# Patient Record
Sex: Female | Born: 1945 | Race: White | Hispanic: No | Marital: Married | Smoking: Former smoker
Health system: Southern US, Community
[De-identification: ages and names within clinical notes are randomized; demographics above are authoritative.]

## PROBLEM LIST (undated history)

## (undated) DIAGNOSIS — H9319 Tinnitus, unspecified ear: Secondary | ICD-10-CM

## (undated) DIAGNOSIS — F329 Major depressive disorder, single episode, unspecified: Secondary | ICD-10-CM

## (undated) DIAGNOSIS — M1990 Unspecified osteoarthritis, unspecified site: Secondary | ICD-10-CM

## (undated) DIAGNOSIS — I10 Essential (primary) hypertension: Secondary | ICD-10-CM

## (undated) DIAGNOSIS — F419 Anxiety disorder, unspecified: Secondary | ICD-10-CM

## (undated) DIAGNOSIS — E039 Hypothyroidism, unspecified: Secondary | ICD-10-CM

## (undated) DIAGNOSIS — Z973 Presence of spectacles and contact lenses: Secondary | ICD-10-CM

## (undated) DIAGNOSIS — F32A Depression, unspecified: Secondary | ICD-10-CM

## (undated) HISTORY — PX: OTHER SURGICAL HISTORY: SHX169

---

## 1970-11-24 HISTORY — PX: OTHER SURGICAL HISTORY: SHX169

## 1998-11-24 HISTORY — PX: ABDOMINAL HYSTERECTOMY: SHX81

## 2014-11-24 HISTORY — PX: OTHER SURGICAL HISTORY: SHX169

## 2014-11-24 HISTORY — PX: CHOLECYSTECTOMY: SHX55

## 2016-02-18 ENCOUNTER — Other Ambulatory Visit (HOSPITAL_COMMUNITY): Payer: Self-pay | Admitting: *Deleted

## 2016-02-18 NOTE — Patient Instructions (Addendum)
Kelly Everett  02/18/2016   Your procedure is scheduled on: 03/04/16  [facility]  [place]trance [place]  elevators to 3rd floor to  Short Stay Center at 0515 AM.  Call this number if you have problems the morning of surgery [phone]   Remember: ONLY 1 PERSON MAY GO WITH YOU TO SHORT STAY TO GET  READY MORNING OF YOUR SURGERY.  Do not eat food or drink liquids :After Midnight. Monday night     Take these medicines the morning of surgery with A SIP OF WATER: Levothyroxine                               You may not have any metal on your body including hair pins and              piercings  Do not wear jewelry, make-up, lotions, powders or perfumes, deodorant             Do not wear nail polish.  Do not shave  48 hours prior to surgery.              Men may shave face and neck.   Do not bring valuables to the hospital. [facility] IS NOT             RESPONSIBLE   FOR VALUABLES.  Contacts, dentures or bridgework may not be worn into surgery.  Leave suitcase in the car. After surgery it may be brought to your room.   _____________________________________________________________________              WHAT IS A BLOOD TRANSFUSION? Blood Transfusion Information  A transfusion is the replacement of blood or some of its parts. Blood is made up of multiple cells which provide different functions.  Red blood cells carry oxygen and are used for blood loss replacement.  White blood cells fight against infection.  Platelets control bleeding.  Plasma helps clot blood.  Other blood products are available for specialized needs, such as hemophilia or other clotting disorders. BEFORE THE TRANSFUSION  Who gives blood for transfusions?   Healthy volunteers who are fully evaluated to make sure their blood is safe. This is blood bank blood. Transfusion therapy is the safest it has ever been in the practice of medicine. Before blood is taken from a  donor, a complete history is taken to make sure that person has no history of diseases nor engages in risky social behavior (examples are intravenous drug use or sexual activity with multiple partners). The donor's travel history is screened to minimize risk of transmitting infections, such as malaria. The donated blood is tested for signs of infectious diseases, such as HIV and hepatitis. The blood is then tested to be sure it is compatible with you in order to minimize the chance of a transfusion reaction. If you or a relative donates blood, this is often done in anticipation of surgery and is not appropriate for emergency situations. It takes many days to process the donated blood. RISKS AND COMPLICATIONS Although transfusion therapy is very safe and saves many lives, the main dangers of transfusion include:  1. Getting an infectious disease. 2. Developing a transfusion reaction. This is an allergic reaction to something in the blood you were given. Every precaution is taken to prevent this. The decision to have a blood  transfusion has been considered carefully by your caregiver before blood is given. Blood is not given unless the benefits outweigh the risks. AFTER THE TRANSFUSION  Right after receiving a blood transfusion, you will usually feel much better and more energetic. This is especially true if your red blood cells have gotten low (anemic). The transfusion raises the level of the red blood cells which carry oxygen, and this usually causes an energy increase.  The nurse administering the transfusion will monitor you carefully for complications. HOME CARE INSTRUCTIONS  No special instructions are needed after a transfusion. You may find your energy is better. Speak with your caregiver about any limitations on activity for underlying diseases you may have. SEEK MEDICAL CARE IF:   Your condition is not improving after your transfusion.  You develop redness or irritation at the intravenous  (IV) site. SEEK IMMEDIATE MEDICAL CARE IF:  Any of the following symptoms occur over the next 12 hours:  Shaking chills.  You have a temperature by mouth above 102 F (38.9 C), not controlled by medicine.  Chest, back, or muscle pain.  People around you feel you are not acting correctly or are confused.  Shortness of breath or difficulty breathing.  Dizziness and fainting.  You get a rash or develop hives.  You have a decrease in urine output.  Your urine turns a dark color or changes to pink, red, or brown. Any of the following symptoms occur over the next 10 days:  You have a temperature by mouth above 102 F (38.9 C), not controlled by medicine.  Shortness of breath.  Weakness after normal activity.  The white part of the eye turns yellow (jaundice).  You have a decrease in the amount of urine or are urinating less often.  Your urine turns a dark color or changes to pink, red, or brown. Document Released: 11/07/2000 Document Revised: 02/02/2012 Document Reviewed: 06/26/2008 ExitCare Patient Information 2014 [place], [place].  _______________________________________________________________________  Incentive Spirometer  An incentive spirometer is a tool that can help keep your lungs clear and active. This tool measures how well you are filling your lungs with each breath. Taking long deep breaths may help reverse or decrease the chance of developing breathing (pulmonary) problems (especially infection) following:  A long period of time when you are unable to move or be active. BEFORE THE PROCEDURE   If the spirometer includes an indicator to show your best effort, your nurse or respiratory therapist will set it to a desired goal.  If possible, sit up straight or lean slightly forward. Try not to slouch.  Hold the incentive spirometer in an upright position. INSTRUCTIONS FOR USE  3. Sit on the edge of your bed if possible, or sit up as far as you can in bed or on  a chair. 4. Hold the incentive spirometer in an upright position. 5. Breathe out normally. 6. Place the mouthpiece in your mouth and seal your lips tightly around it. 7. Breathe in slowly and as deeply as possible, raising the piston or the ball toward the top of the column. 8. Hold your breath for 3-5 seconds or for as long as possible. Allow the piston or ball to fall to the bottom of the column. 9. Remove the mouthpiece from your mouth and breathe out normally. 10. Rest for a few seconds and repeat Steps 1 through 7 at least 10 times every 1-2 hours when you are awake. Take your time and take a few normal breaths between deep breaths.  11. The spirometer may include an indicator to show your best effort. Use the indicator as a goal to work toward during each repetition. 12. After each set of 10 deep breaths, practice coughing to be sure your lungs are clear. If you have an incision (the cut made at the time of surgery), support your incision when coughing by placing a pillow or rolled up towels firmly against it. Once you are able to get out of bed, walk around indoors and cough well. You may stop using the incentive spirometer when instructed by your caregiver.  RISKS AND COMPLICATIONS  Take your time so you do not get dizzy or light-headed.  If you are in pain, you may need to take or ask for pain medication before doing incentive spirometry. It is harder to take a deep breath if you are having pain. AFTER USE  Rest and breathe slowly and easily.  It can be helpful to keep track of a log of your progress. Your caregiver can provide you with a simple table to help with this. If you are using the spirometer at home, follow these instructions: SEEK MEDICAL CARE IF:   You are having difficultly using the spirometer.  You have trouble using the spirometer as often as instructed.  Your pain medication is not giving enough relief while using the spirometer.  You develop fever of 100.5 F  (38.1 C) or higher. SEEK IMMEDIATE MEDICAL CARE IF:   You cough up bloody sputum that had not been present before.  You develop fever of 102 F (38.9 C) or greater.  You develop worsening pain at or near the incision site. MAKE SURE YOU:   Understand these instructions.  Will watch your condition.  Will get help right away if you are not doing well or get worse. Document Released: 03/23/2007 Document Revised: 02/02/2012 Document Reviewed: 05/24/2007 ExitCare Patient Information 2014 [place].   ________________________________________________________________________ [facility] - Preparing for Surgery Before surgery, you can play an important role.  Because skin is not sterile, your skin needs to be as free of germs as possible.  You can reduce the number of germs on your skin by washing with CHG (chlorahexidine gluconate) soap before surgery.  CHG is an antiseptic cleaner which kills germs and bonds with the skin to continue killing germs even after washing. Please DO NOT use if you have an allergy to CHG or antibacterial soaps.  If your skin becomes reddened/irritated stop using the CHG and inform your nurse when you arrive at Short Stay. Do not shave (including legs and underarms) for at least 48 hours prior to the first CHG shower.  You may shave your face/neck. Please follow these instructions carefully:  1.  Shower with CHG Soap the night before surgery and the  morning of Surgery.  2.  If you choose to wash your hair, wash your hair first as usual with your  normal  shampoo.  3.  After you shampoo, rinse your hair and body thoroughly to remove the  shampoo.                           4.  Use CHG as you would any other liquid soap.  You can apply chg directly  to the skin and wash                       Gently with a scrungie or clean washcloth.  5.  Apply the CHG Soap to your body ONLY FROM THE NECK DOWN.   Do not use on face/ open                           Wound or open  sores. Avoid contact with eyes, ears mouth and genitals (private parts).                       Wash face,  Genitals (private parts) with your normal soap.             6.  Wash thoroughly, paying special attention to the area where your surgery  will be performed.  7.  Thoroughly rinse your body with warm water from the neck down.  8.  DO NOT shower/wash with your normal soap after using and rinsing off  the CHG Soap.                9.  Pat yourself dry with a clean towel.            10.  Wear clean pajamas.            11.  Place clean sheets on your bed the night of your first shower and do not  sleep with pets. Day of Surgery : Do not apply any lotions/deodorants the morning of surgery.  Please wear clean clothes to the hospital/surgery center.  FAILURE TO FOLLOW THESE INSTRUCTIONS MAY RESULT IN THE CANCELLATION OF YOUR SURGERY PATIENT SIGNATURE_________________________________  NURSE SIGNATURE__________________________________  ________________________________________________________________________

## 2016-02-18 NOTE — Progress Notes (Signed)
Clearance Dr Janeece FittingEggleston-Clark on chart Chest 8/16 chart

## 2016-02-19 ENCOUNTER — Encounter (HOSPITAL_COMMUNITY): Payer: Self-pay

## 2016-02-19 ENCOUNTER — Encounter (HOSPITAL_COMMUNITY)
Admission: RE | Admit: 2016-02-19 | Discharge: 2016-02-19 | Disposition: A | Discharge status: Home / Self Care | Payer: Medicare Other | Source: Ambulatory Visit | Attending: Orthopedic Surgery | Admitting: Orthopedic Surgery

## 2016-02-19 DIAGNOSIS — Z01810 Encounter for preprocedural cardiovascular examination: Secondary | ICD-10-CM | POA: Insufficient documentation

## 2016-02-19 DIAGNOSIS — Z01812 Encounter for preprocedural laboratory examination: Secondary | ICD-10-CM | POA: Insufficient documentation

## 2016-02-19 HISTORY — DX: Hypothyroidism, unspecified: E03.9

## 2016-02-19 HISTORY — DX: Depression, unspecified: F32.A

## 2016-02-19 HISTORY — DX: Unspecified osteoarthritis, unspecified site: M19.90

## 2016-02-19 HISTORY — DX: Essential (primary) hypertension: I10

## 2016-02-19 HISTORY — DX: Major depressive disorder, single episode, unspecified: F32.9

## 2016-02-19 LAB — URINALYSIS, ROUTINE W REFLEX MICROSCOPIC
Bilirubin Urine: NEGATIVE
Glucose, UA: NEGATIVE mg/dL
HGB URINE DIPSTICK: NEGATIVE
KETONES UR: NEGATIVE mg/dL
Leukocytes, UA: NEGATIVE
Nitrite: NEGATIVE
PROTEIN: NEGATIVE mg/dL
Specific Gravity, Urine: 1.017 (ref 1.005–1.030)
pH: 6.5 (ref 5.0–8.0)

## 2016-02-19 LAB — CBC
HEMATOCRIT: 43.9 % (ref 36.0–46.0)
HEMOGLOBIN: 14.4 g/dL (ref 12.0–15.0)
MCH: 30.6 pg (ref 26.0–34.0)
MCHC: 32.8 g/dL (ref 30.0–36.0)
MCV: 93.2 fL (ref 78.0–100.0)
Platelets: 177 10*3/uL (ref 150–400)
RBC: 4.71 MIL/uL (ref 3.87–5.11)
RDW: 13.6 % (ref 11.5–15.5)
WBC: 5.7 10*3/uL (ref 4.0–10.5)

## 2016-02-19 LAB — BASIC METABOLIC PANEL
ANION GAP: 10 (ref 5–15)
BUN: 12 mg/dL (ref 6–20)
CALCIUM: 9.6 mg/dL (ref 8.9–10.3)
CO2: 29 mmol/L (ref 22–32)
Chloride: 105 mmol/L (ref 101–111)
Creatinine, Ser: 0.64 mg/dL (ref 0.44–1.00)
GLUCOSE: 105 mg/dL — AB (ref 65–99)
POTASSIUM: 4.3 mmol/L (ref 3.5–5.1)
Sodium: 144 mmol/L (ref 135–145)

## 2016-02-19 LAB — SURGICAL PCR SCREEN
MRSA, PCR: NEGATIVE
STAPHYLOCOCCUS AUREUS: NEGATIVE

## 2016-02-19 LAB — PROTIME-INR
INR: 1.13 (ref 0.00–1.49)
PROTHROMBIN TIME: 14.7 s (ref 11.6–15.2)

## 2016-02-19 LAB — APTT: APTT: 28 s (ref 24–37)

## 2016-02-22 NOTE — H&P (Signed)
TOTAL HIP ADMISSION H&P  Patient is admitted for right total hip arthroplasty, anterior approach.  Subjective:  Chief Complaint:   Right hip primary OA / pain  HPI: Kelly Everett, 70 y.o. female, has a history of pain and functional disability in the right hip(s) due to arthritis and patient has failed non-surgical conservative treatments for greater than 12 weeks to include NSAID's and/or analgesics, corticosteriod injections and activity modification.  Onset of symptoms was gradual starting 3+ years ago with gradually worsening course since that time.The patient noted no past surgery on the right hip(s).  Patient currently rates pain in the right hip at 8 out of 10 with activity. Patient has night pain, worsening of pain with activity and weight bearing, trendelenberg gait, pain that interfers with activities of daily living and pain with passive range of motion. Patient has evidence of periarticular osteophytes and joint space narrowing by imaging studies. This condition presents safety issues increasing the risk of falls.  There is no current active infection.  Risks, benefits and expectations were discussed with the patient.  Risks including but not limited to the risk of anesthesia, blood clots, nerve damage, blood vessel damage, failure of the prosthesis, infection and up to and including death.  Patient understand the risks, benefits and expectations and wishes to proceed with surgery.   PCP: Valla LeaverEGGLESTON-CLARK,VALENICA, MD  D/C Plans:      Home  Post-op Meds:       No Rx given  Tranexamic Acid:      To be given - IV  Decadron:      Is to be given  FYI:     ASA  Norco    Past Medical History  Diagnosis Date  . Hypertension   . Hypothyroidism   . Depression   . Arthritis     OA    Past Surgical History  Procedure Laterality Date  . Cesarean section  1974 AND 1980  . Surgery for endometriossis  1972  . Abdominal hysterectomy  2000    COMPLETE  . Complete thyroidectomy   2016  . Cholecystectomy  2016  . Thumb surgery Bilateral YRS AGO    No prescriptions prior to admission   No Known Allergies   Social History  Substance Use Topics  . Smoking status: Never Smoker   . Smokeless tobacco: Never Used  . Alcohol Use: Yes     Comment: OCCASIONAL       Review of Systems  Constitutional: Negative.   HENT: Negative.   Eyes: Negative.   Respiratory: Negative.   Cardiovascular: Negative.   Gastrointestinal: Negative.   Genitourinary: Negative.   Musculoskeletal: Positive for joint pain.  Skin: Negative.   Neurological: Negative.   Endo/Heme/Allergies: Negative.   Psychiatric/Behavioral: Positive for depression.    Objective:  Physical Exam  Constitutional: She is oriented to person, place, and time. She appears well-developed.  HENT:  Head: Normocephalic.  Eyes: Pupils are equal, round, and reactive to light.  Neck: Neck supple. No JVD present. No tracheal deviation present. No thyromegaly present.  Cardiovascular: Normal rate, regular rhythm, normal heart sounds and intact distal pulses.   Respiratory: Effort normal and breath sounds normal. No stridor. No respiratory distress. She has no wheezes.  GI: Soft. There is no tenderness. There is no guarding.  Musculoskeletal:       Right hip: She exhibits decreased range of motion, decreased strength, tenderness and bony tenderness. She exhibits no swelling, no deformity and no laceration.  Lymphadenopathy:  She has no cervical adenopathy.  Neurological: She is alert and oriented to person, place, and time.  Skin: Skin is warm and dry.  Psychiatric: She has a normal mood and affect.      Imaging Review Plain radiographs demonstrate severe degenerative joint disease of the right hip(s). The bone quality appears to be good for age and reported activity level.  Assessment/Plan:  End stage arthritis, right hip(s)  The patient history, physical examination, clinical judgement of the  provider and imaging studies are consistent with end stage degenerative joint disease of the right hip(s) and total hip arthroplasty is deemed medically necessary. The treatment options including medical management, injection therapy, arthroscopy and arthroplasty were discussed at length. The risks and benefits of total hip arthroplasty were presented and reviewed. The risks due to aseptic loosening, infection, stiffness, dislocation/subluxation,  thromboembolic complications and other imponderables were discussed.  The patient acknowledged the explanation, agreed to proceed with the plan and consent was signed. Patient is being admitted for inpatient treatment for surgery, pain control, PT, OT, prophylactic antibiotics, VTE prophylaxis, progressive ambulation and ADL's and discharge planning.The patient is planning to be discharged home.       Anastasio Auerbach Joyce Leckey   PA-C  02/22/2016, 7:45 AM

## 2016-03-03 NOTE — Anesthesia Preprocedure Evaluation (Addendum)
Anesthesia Evaluation  Patient identified by MRN, date of birth, ID band Patient awake    Reviewed: Allergy & Precautions, NPO status , Patient's Chart, lab work & pertinent test results  History of Anesthesia Complications Negative for: history of anesthetic complications  Airway Mallampati: II   Neck ROM: Full    Dental  (+) Dental Advisory Given, Caps   Pulmonary neg pulmonary ROS,    Pulmonary exam normal        Cardiovascular hypertension, Pt. on medications Normal cardiovascular exam     Neuro/Psych PSYCHIATRIC DISORDERS Depression negative neurological ROS     GI/Hepatic negative GI ROS, Neg liver ROS,   Endo/Other  Hypothyroidism   Renal/GU negative Renal ROS     Musculoskeletal  (+) Arthritis ,   Abdominal   Peds  Hematology   Anesthesia Other Findings   Reproductive/Obstetrics                          Anesthesia Physical Anesthesia Plan  ASA: II  Anesthesia Plan: MAC and Spinal   Post-op Pain Management:    Induction:   Airway Management Planned: Simple Face Mask  Additional Equipment:   Intra-op Plan:   Post-operative Plan:   Informed Consent: I have reviewed the patients History and Physical, chart, labs and discussed the procedure including the risks, benefits and alternatives for the proposed anesthesia with the patient or authorized representative who has indicated his/her understanding and acceptance.   Dental advisory given  Plan Discussed with: Anesthesiologist, CRNA and Surgeon  Anesthesia Plan Comments:        Anesthesia Quick Evaluation

## 2016-03-04 ENCOUNTER — Inpatient Hospital Stay (HOSPITAL_COMMUNITY): Payer: Medicare Other | Admitting: Anesthesiology

## 2016-03-04 ENCOUNTER — Inpatient Hospital Stay (HOSPITAL_COMMUNITY): Payer: Medicare Other

## 2016-03-04 ENCOUNTER — Encounter (HOSPITAL_COMMUNITY): Payer: Self-pay | Admitting: *Deleted

## 2016-03-04 ENCOUNTER — Inpatient Hospital Stay (HOSPITAL_COMMUNITY)
Admission: RE | Admit: 2016-03-04 | Discharge: 2016-03-05 | DRG: 470 | Disposition: A | Discharge status: Home Health | Payer: Medicare Other | Source: Ambulatory Visit | Attending: Orthopedic Surgery | Admitting: Orthopedic Surgery

## 2016-03-04 ENCOUNTER — Encounter (HOSPITAL_COMMUNITY)
Admission: RE | Disposition: A | Discharge status: Home Health | Payer: Self-pay | Source: Ambulatory Visit | Attending: Orthopedic Surgery

## 2016-03-04 DIAGNOSIS — Z9049 Acquired absence of other specified parts of digestive tract: Secondary | ICD-10-CM | POA: Diagnosis not present

## 2016-03-04 DIAGNOSIS — Z90710 Acquired absence of both cervix and uterus: Secondary | ICD-10-CM | POA: Diagnosis not present

## 2016-03-04 DIAGNOSIS — Z01812 Encounter for preprocedural laboratory examination: Secondary | ICD-10-CM | POA: Diagnosis not present

## 2016-03-04 DIAGNOSIS — Z6831 Body mass index (BMI) 31.0-31.9, adult: Secondary | ICD-10-CM | POA: Diagnosis not present

## 2016-03-04 DIAGNOSIS — I10 Essential (primary) hypertension: Secondary | ICD-10-CM | POA: Diagnosis present

## 2016-03-04 DIAGNOSIS — M1611 Unilateral primary osteoarthritis, right hip: Secondary | ICD-10-CM | POA: Diagnosis present

## 2016-03-04 DIAGNOSIS — M25551 Pain in right hip: Secondary | ICD-10-CM | POA: Diagnosis present

## 2016-03-04 DIAGNOSIS — F329 Major depressive disorder, single episode, unspecified: Secondary | ICD-10-CM | POA: Diagnosis present

## 2016-03-04 DIAGNOSIS — E890 Postprocedural hypothyroidism: Secondary | ICD-10-CM | POA: Diagnosis present

## 2016-03-04 DIAGNOSIS — Z96649 Presence of unspecified artificial hip joint: Secondary | ICD-10-CM

## 2016-03-04 DIAGNOSIS — E669 Obesity, unspecified: Secondary | ICD-10-CM | POA: Diagnosis present

## 2016-03-04 HISTORY — PX: TOTAL HIP ARTHROPLASTY: SHX124

## 2016-03-04 LAB — TYPE AND SCREEN
ABO/RH(D): A POS
ANTIBODY SCREEN: NEGATIVE

## 2016-03-04 LAB — ABO/RH: ABO/RH(D): A POS

## 2016-03-04 SURGERY — ARTHROPLASTY, HIP, TOTAL, ANTERIOR APPROACH
Anesthesia: Monitor Anesthesia Care | Site: Hip | Laterality: Right

## 2016-03-04 MED ORDER — MAGNESIUM CITRATE PO SOLN: 1.0000 | Freq: Once | ORAL | Status: DC | PRN

## 2016-03-04 MED ORDER — SODIUM CHLORIDE 0.9 % IV SOLN
100.0000 mL/h | INTRAVENOUS | Status: DC
  Administered 2016-03-04 – 2016-03-05 (×3): 100 mL/h via INTRAVENOUS
  Filled 2016-03-04 (×6): qty 1000

## 2016-03-04 MED ORDER — PHENOL 1.4 % MT LIQD: 1.0000 | OROMUCOSAL | Status: DC | PRN

## 2016-03-04 MED ORDER — MIDAZOLAM HCL 5 MG/5ML IJ SOLN
INTRAMUSCULAR | Status: DC | PRN
  Administered 2016-03-04 (×2): 1 mg via INTRAVENOUS

## 2016-03-04 MED ORDER — POLYETHYLENE GLYCOL 3350 17 G PO PACK
17.0000 g | PACK | Freq: Two times a day (BID) | ORAL | Status: DC
  Administered 2016-03-04 – 2016-03-05 (×2): 17 g via ORAL

## 2016-03-04 MED ORDER — HYDROMORPHONE HCL 1 MG/ML IJ SOLN
0.2500 mg | INTRAMUSCULAR | Status: DC | PRN
  Administered 2016-03-04 (×2): 0.5 mg via INTRAVENOUS

## 2016-03-04 MED ORDER — METHOCARBAMOL 1000 MG/10ML IJ SOLN
500.0000 mg | Freq: Four times a day (QID) | INTRAVENOUS | Status: DC | PRN
  Administered 2016-03-04: 500 mg via INTRAVENOUS
  Filled 2016-03-04 (×2): qty 5

## 2016-03-04 MED ORDER — CEFAZOLIN SODIUM-DEXTROSE 2-4 GM/100ML-% IV SOLN
2.0000 g | Freq: Four times a day (QID) | INTRAVENOUS | Status: AC
  Administered 2016-03-04 (×2): 2 g via INTRAVENOUS
  Filled 2016-03-04 (×2): qty 100

## 2016-03-04 MED ORDER — ALUM & MAG HYDROXIDE-SIMETH 200-200-20 MG/5ML PO SUSP: 30.0000 mL | ORAL | Status: DC | PRN

## 2016-03-04 MED ORDER — METRONIDAZOLE 0.75 % EX GEL
1.0000 "application " | Freq: Two times a day (BID) | CUTANEOUS | Status: DC
  Administered 2016-03-04 – 2016-03-05 (×2): 1 via TOPICAL
  Filled 2016-03-04: qty 45

## 2016-03-04 MED ORDER — METOCLOPRAMIDE HCL 5 MG/ML IJ SOLN: 5.0000 mg | Freq: Three times a day (TID) | INTRAMUSCULAR | Status: DC | PRN

## 2016-03-04 MED ORDER — STERILE WATER FOR IRRIGATION IR SOLN
Status: DC | PRN
  Administered 2016-03-04: 2000 mL

## 2016-03-04 MED ORDER — PROPOFOL 10 MG/ML IV BOLUS
INTRAVENOUS | Status: AC
  Filled 2016-03-04: qty 20

## 2016-03-04 MED ORDER — FERROUS SULFATE 325 (65 FE) MG PO TABS
325.0000 mg | ORAL_TABLET | Freq: Three times a day (TID) | ORAL | Status: DC
  Administered 2016-03-05 (×2): 325 mg via ORAL
  Filled 2016-03-04 (×5): qty 1

## 2016-03-04 MED ORDER — DOCUSATE SODIUM 100 MG PO CAPS
100.0000 mg | ORAL_CAPSULE | Freq: Two times a day (BID) | ORAL | Status: DC
  Administered 2016-03-04 – 2016-03-05 (×2): 100 mg via ORAL

## 2016-03-04 MED ORDER — DEXAMETHASONE SODIUM PHOSPHATE 10 MG/ML IJ SOLN: 10.0000 mg | Freq: Once | INTRAMUSCULAR | Status: DC

## 2016-03-04 MED ORDER — HYDROCODONE-ACETAMINOPHEN 7.5-325 MG PO TABS
1.0000 | ORAL_TABLET | ORAL | Status: DC
  Administered 2016-03-04 – 2016-03-05 (×7): 2 via ORAL
  Filled 2016-03-04 (×7): qty 2

## 2016-03-04 MED ORDER — METHOCARBAMOL 500 MG PO TABS
500.0000 mg | ORAL_TABLET | Freq: Four times a day (QID) | ORAL | Status: DC | PRN
  Administered 2016-03-04 – 2016-03-05 (×2): 500 mg via ORAL
  Filled 2016-03-04 (×2): qty 1

## 2016-03-04 MED ORDER — FENTANYL CITRATE (PF) 250 MCG/5ML IJ SOLN
INTRAMUSCULAR | Status: DC | PRN
  Administered 2016-03-04 (×4): 50 ug via INTRAVENOUS

## 2016-03-04 MED ORDER — HYDROMORPHONE HCL 1 MG/ML IJ SOLN
0.5000 mg | INTRAMUSCULAR | Status: DC | PRN
  Administered 2016-03-04: 1 mg via INTRAVENOUS
  Filled 2016-03-04: qty 1

## 2016-03-04 MED ORDER — BUPIVACAINE IN DEXTROSE 0.75-8.25 % IT SOLN
INTRATHECAL | Status: DC | PRN
  Administered 2016-03-04: 2 mL via INTRATHECAL

## 2016-03-04 MED ORDER — LOSARTAN POTASSIUM-HCTZ 100-25 MG PO TABS: 1.0000 | ORAL_TABLET | Freq: Every morning | ORAL | Status: DC

## 2016-03-04 MED ORDER — PROPOFOL 10 MG/ML IV BOLUS
INTRAVENOUS | Status: AC
  Filled 2016-03-04: qty 40

## 2016-03-04 MED ORDER — ASPIRIN EC 325 MG PO TBEC
325.0000 mg | DELAYED_RELEASE_TABLET | Freq: Two times a day (BID) | ORAL | Status: DC
  Administered 2016-03-05: 325 mg via ORAL
  Filled 2016-03-04 (×3): qty 1

## 2016-03-04 MED ORDER — TRANEXAMIC ACID 1000 MG/10ML IV SOLN
1000.0000 mg | Freq: Once | INTRAVENOUS | Status: AC
  Administered 2016-03-04: 1000 mg via INTRAVENOUS
  Filled 2016-03-04: qty 10

## 2016-03-04 MED ORDER — MENTHOL 3 MG MT LOZG: 1.0000 | LOZENGE | OROMUCOSAL | Status: DC | PRN

## 2016-03-04 MED ORDER — ONDANSETRON HCL 4 MG/2ML IJ SOLN: 4.0000 mg | Freq: Four times a day (QID) | INTRAMUSCULAR | Status: DC | PRN

## 2016-03-04 MED ORDER — CELECOXIB 200 MG PO CAPS
200.0000 mg | ORAL_CAPSULE | Freq: Two times a day (BID) | ORAL | Status: DC
  Administered 2016-03-04 – 2016-03-05 (×2): 200 mg via ORAL
  Filled 2016-03-04 (×3): qty 1

## 2016-03-04 MED ORDER — DULOXETINE HCL 60 MG PO CPEP
60.0000 mg | ORAL_CAPSULE | Freq: Every day | ORAL | Status: DC
  Administered 2016-03-04: 60 mg via ORAL
  Filled 2016-03-04 (×2): qty 1

## 2016-03-04 MED ORDER — LACTATED RINGERS IV SOLN
INTRAVENOUS | Status: DC | PRN
  Administered 2016-03-04 (×2): via INTRAVENOUS

## 2016-03-04 MED ORDER — LOSARTAN POTASSIUM 50 MG PO TABS
100.0000 mg | ORAL_TABLET | Freq: Every day | ORAL | Status: DC
  Administered 2016-03-05: 100 mg via ORAL
  Filled 2016-03-04: qty 2

## 2016-03-04 MED ORDER — HYDROCHLOROTHIAZIDE 25 MG PO TABS
25.0000 mg | ORAL_TABLET | Freq: Every day | ORAL | Status: DC
  Administered 2016-03-05: 25 mg via ORAL
  Filled 2016-03-04: qty 1

## 2016-03-04 MED ORDER — DEXAMETHASONE SODIUM PHOSPHATE 10 MG/ML IJ SOLN
10.0000 mg | Freq: Once | INTRAMUSCULAR | Status: AC
  Administered 2016-03-05: 10 mg via INTRAVENOUS
  Filled 2016-03-04: qty 1

## 2016-03-04 MED ORDER — AMLODIPINE BESYLATE 5 MG PO TABS
5.0000 mg | ORAL_TABLET | Freq: Every day | ORAL | Status: DC
  Administered 2016-03-04: 5 mg via ORAL
  Filled 2016-03-04 (×2): qty 1

## 2016-03-04 MED ORDER — 0.9 % SODIUM CHLORIDE (POUR BTL) OPTIME
TOPICAL | Status: DC | PRN
  Administered 2016-03-04: 1000 mL

## 2016-03-04 MED ORDER — ONDANSETRON HCL 4 MG PO TABS: 4.0000 mg | ORAL_TABLET | Freq: Four times a day (QID) | ORAL | Status: DC | PRN

## 2016-03-04 MED ORDER — METOCLOPRAMIDE HCL 5 MG PO TABS
5.0000 mg | ORAL_TABLET | Freq: Three times a day (TID) | ORAL | Status: DC | PRN
  Filled 2016-03-04: qty 2

## 2016-03-04 MED ORDER — LIDOCAINE HCL (CARDIAC) 20 MG/ML IV SOLN
INTRAVENOUS | Status: DC | PRN
  Administered 2016-03-04: 80 mg via INTRAVENOUS

## 2016-03-04 MED ORDER — MIDAZOLAM HCL 2 MG/2ML IJ SOLN
INTRAMUSCULAR | Status: AC
  Filled 2016-03-04: qty 2

## 2016-03-04 MED ORDER — CEFAZOLIN SODIUM-DEXTROSE 2-4 GM/100ML-% IV SOLN
2.0000 g | INTRAVENOUS | Status: AC
  Administered 2016-03-04: 2 g via INTRAVENOUS

## 2016-03-04 MED ORDER — PROPOFOL 500 MG/50ML IV EMUL
INTRAVENOUS | Status: DC | PRN
  Administered 2016-03-04: 25 ug/kg/min via INTRAVENOUS

## 2016-03-04 MED ORDER — CEFAZOLIN SODIUM-DEXTROSE 2-4 GM/100ML-% IV SOLN
INTRAVENOUS | Status: AC
  Filled 2016-03-04: qty 100

## 2016-03-04 MED ORDER — FENTANYL CITRATE (PF) 250 MCG/5ML IJ SOLN
INTRAMUSCULAR | Status: AC
  Filled 2016-03-04: qty 5

## 2016-03-04 MED ORDER — PROPOFOL 10 MG/ML IV BOLUS
INTRAVENOUS | Status: DC | PRN
  Administered 2016-03-04: 30 mg via INTRAVENOUS

## 2016-03-04 MED ORDER — DIPHENHYDRAMINE HCL 25 MG PO CAPS: 25.0000 mg | ORAL_CAPSULE | Freq: Four times a day (QID) | ORAL | Status: DC | PRN

## 2016-03-04 MED ORDER — BISACODYL 10 MG RE SUPP: 10.0000 mg | Freq: Every day | RECTAL | Status: DC | PRN

## 2016-03-04 MED ORDER — DEXAMETHASONE SODIUM PHOSPHATE 10 MG/ML IJ SOLN
INTRAMUSCULAR | Status: DC | PRN
  Administered 2016-03-04: 10 mg via INTRAVENOUS

## 2016-03-04 MED ORDER — ROSUVASTATIN CALCIUM 10 MG PO TABS
10.0000 mg | ORAL_TABLET | Freq: Every day | ORAL | Status: DC
  Administered 2016-03-04: 10 mg via ORAL
  Filled 2016-03-04 (×2): qty 1

## 2016-03-04 MED ORDER — HYDROMORPHONE HCL 1 MG/ML IJ SOLN
INTRAMUSCULAR | Status: AC
  Administered 2016-03-04: 1 mg via INTRAVENOUS
  Filled 2016-03-04: qty 1

## 2016-03-04 MED ORDER — LEVOTHYROXINE SODIUM 88 MCG PO TABS
88.0000 ug | ORAL_TABLET | Freq: Every day | ORAL | Status: DC
  Administered 2016-03-05: 88 ug via ORAL
  Filled 2016-03-04 (×2): qty 1

## 2016-03-04 SURGICAL SUPPLY — 32 items
CAPT HIP TOTAL 2 ×3 IMPLANT
CLOTH BEACON ORANGE TIMEOUT ST (SAFETY) ×3 IMPLANT
COVER PERINEAL POST (MISCELLANEOUS) ×3 IMPLANT
DRAPE STERI IOBAN 125X83 (DRAPES) ×3 IMPLANT
DRAPE U-SHAPE 47X51 STRL (DRAPES) ×6 IMPLANT
DRSG AQUACEL AG ADV 3.5X10 (GAUZE/BANDAGES/DRESSINGS) ×3 IMPLANT
DURAPREP 26ML APPLICATOR (WOUND CARE) ×3 IMPLANT
ELECT REM PT RETURN 15FT ADLT (MISCELLANEOUS) IMPLANT
ELECT REM PT RETURN 9FT ADLT (ELECTROSURGICAL) ×3
ELECTRODE REM PT RTRN 9FT ADLT (ELECTROSURGICAL) ×1 IMPLANT
GLOVE BIOGEL PI IND STRL 7.0 (GLOVE) ×1 IMPLANT
GLOVE BIOGEL PI IND STRL 7.5 (GLOVE) ×3 IMPLANT
GLOVE BIOGEL PI IND STRL 8.5 (GLOVE) ×1 IMPLANT
GLOVE BIOGEL PI INDICATOR 7.0 (GLOVE) ×2
GLOVE BIOGEL PI INDICATOR 7.5 (GLOVE) ×6
GLOVE BIOGEL PI INDICATOR 8.5 (GLOVE) ×2
GLOVE ECLIPSE 8.0 STRL XLNG CF (GLOVE) ×6 IMPLANT
GLOVE ORTHO TXT STRL SZ7.5 (GLOVE) ×3 IMPLANT
GLOVE SURG SS PI 7.5 STRL IVOR (GLOVE) ×3 IMPLANT
GOWN STRL REUS W/TWL LRG LVL3 (GOWN DISPOSABLE) ×3 IMPLANT
GOWN STRL REUS W/TWL XL LVL3 (GOWN DISPOSABLE) ×9 IMPLANT
HOLDER FOLEY CATH W/STRAP (MISCELLANEOUS) ×3 IMPLANT
LIQUID BAND (GAUZE/BANDAGES/DRESSINGS) ×3 IMPLANT
PACK ANTERIOR HIP CUSTOM (KITS) ×3 IMPLANT
SAW OSC TIP CART 19.5X105X1.3 (SAW) ×3 IMPLANT
SUT MNCRL AB 4-0 PS2 18 (SUTURE) ×3 IMPLANT
SUT VIC AB 1 CT1 36 (SUTURE) ×9 IMPLANT
SUT VIC AB 2-0 CT1 27 (SUTURE) ×4
SUT VIC AB 2-0 CT1 TAPERPNT 27 (SUTURE) ×2 IMPLANT
SUT VLOC 180 0 24IN GS25 (SUTURE) ×3 IMPLANT
TRAY FOLEY W/METER SILVER 14FR (SET/KITS/TRAYS/PACK) IMPLANT
YANKAUER SUCT BULB TIP 10FT TU (MISCELLANEOUS) ×3 IMPLANT

## 2016-03-04 NOTE — Anesthesia Postprocedure Evaluation (Signed)
Anesthesia Post Note  Patient: Kelly DaltonAnnelle F Siegmann  Procedure(s) Performed: Procedure(s) (LRB): TOTAL RIGHT HIP ARTHROPLASTY ANTERIOR APPROACH (Right)  Patient location during evaluation: PACU Anesthesia Type: Spinal Level of consciousness: oriented and awake and alert Pain management: pain level controlled Vital Signs Assessment: post-procedure vital signs reviewed and stable Respiratory status: spontaneous breathing, respiratory function stable and patient connected to nasal cannula oxygen Cardiovascular status: blood pressure returned to baseline and stable Postop Assessment: no headache and no backache Anesthetic complications: no    Last Vitals:  Filed Vitals:   03/04/16 1000 03/04/16 1039  BP: 110/62 118/61  Pulse: 82   Temp:  36.4 C  Resp: 10     Last Pain:  Filed Vitals:   03/04/16 1040  PainSc: 5                  Varnika Butz DANIEL

## 2016-03-04 NOTE — Evaluation (Signed)
Physical Therapy Evaluation Patient Details Name: Kelly Everett MRN: [id number] DOB: Apr 13, 1946 Today's Date: 03/04/2016   History of Present Illness  R DA THA  Clinical Impression  The patient is mobilizing well, ambulated x 100'.  Plans DC tomorrow. Practice steps and exercises tomorrow. The patient will benefit from PT to address the problems listed in the note below.    Follow Up Recommendations No PT follow up;Supervision - Intermittent    Equipment Recommendations  Rolling walker with 5" wheels    Recommendations for Other Services       Precautions / Restrictions Precautions Precautions: Fall      Mobility  Bed Mobility Overal bed mobility: Needs Assistance Bed Mobility: Supine to Sit     Supine to sit: Min assist;HOB elevated     General bed mobility comments: cues for technique.  Transfers Overall transfer level: Needs assistance Equipment used: Rolling walker (2 wheeled) Transfers: Sit to/from Stand Sit to Stand: Min assist         General transfer comment: cues for hand placement and R leg.  Ambulation/Gait Ambulation/Gait assistance: Min assist Ambulation Distance (Feet): 100 Feet Assistive device: Rolling walker (2 wheeled) Gait Pattern/deviations: Step-to pattern;Step-through pattern     General Gait Details: cues for sequence  Stairs            Wheelchair Mobility    Modified Rankin (Stroke Patients Only)       Balance                                             Pertinent Vitals/Pain Pain Assessment: 0-10 Pain Score: 4  Pain Location: R thigh Pain Descriptors / Indicators: Tightness;Sore Pain Intervention(s): Repositioned;Ice applied;Patient requesting pain meds-RN notified;Monitored during session    Home Living Family/patient expects to be discharged to:: Private residence Living Arrangements: Spouse/significant other Available Help at Discharge: Family Type of Home: House Home Access: Stairs  to enter Entrance Stairs-Rails: Doctor, general practice of Steps: 5 Home Layout: One level Home Equipment: Bedside commode      Prior Function Level of Independence: Independent               Hand Dominance        Extremity/Trunk Assessment   Upper Extremity Assessment: Defer to OT evaluation           Lower Extremity Assessment: RLE deficits/detail RLE Deficits / Details: flexes knee and hip in supine    Cervical / Trunk Assessment: Normal  Communication   Communication: No difficulties  Cognition Arousal/Alertness: Awake/alert Behavior During Therapy: WFL for tasks assessed/performed Overall Cognitive Status: Within Functional Limits for tasks assessed                      General Comments      Exercises Total Joint Exercises Heel Slides: AROM;Right;5 reps;Supine Long Arc Quad: AROM;Right;10 reps;Seated      Assessment/Plan    PT Assessment Patient needs continued PT services  PT Diagnosis Difficulty walking;Acute pain   PT Problem List Decreased strength;Decreased range of motion;Decreased activity tolerance;Decreased mobility;Decreased knowledge of use of DME;Decreased safety awareness;Decreased knowledge of precautions;Pain  PT Treatment Interventions DME instruction;Gait training;Stair training;Functional mobility training;Therapeutic activities;Therapeutic exercise;Patient/family education   PT Goals (Current goals can be found in the Care Plan section) Acute Rehab PT Goals Patient Stated Goal: to walk without pain PT Goal Formulation: With patient/family  Time For Goal Achievement: 03/07/16 Potential to Achieve Goals: Good    Frequency 7X/week   Barriers to discharge        Co-evaluation               End of Session   Activity Tolerance: Patient tolerated treatment well Patient left: in chair;with call bell/phone within reach;with family/visitor present Nurse Communication: Mobility status         Time:  [id number] PT Time Calculation (min) (ACUTE ONLY): 33 min   Charges:   PT Evaluation $PT Eval Low Complexity: 1 Procedure PT Treatments $Gait Training: 8-22 mins   PT G Codes:        [name], Jayanth Szczesniak Elizabeth 03/04/2016, 5:06 PM Blanchard KelchKaren Jet Traynham PT [phone]

## 2016-03-04 NOTE — Anesthesia Procedure Notes (Signed)
Spinal Patient location during procedure: OR Staffing Resident/CRNA: Minerva EndsMIRARCHI, Ousmane Seeman M Performed by: resident/CRNA  Preanesthetic Checklist Completed: patient identified, site marked, surgical consent, pre-op evaluation, timeout performed, IV checked, risks and benefits discussed and monitors and equipment checked Spinal Block Patient position: sitting Prep: Betadine and site prepped and draped Patient monitoring: heart rate, continuous pulse ox and blood pressure Approach: midline Location: L3-4 Injection technique: single-shot Needle Needle type: Sprotte  Needle gauge: 24 G Assessment Sensory level: T4 Additional Notes Expiration date of tray noted and within date.   Patient tolerated procedure well.  Assisted and supervised by Dr Krista BlueSinger- + blood needle withdrawn- Krista BlueSinger placed spinal paramedian approach + CSF

## 2016-03-04 NOTE — Interval H&P Note (Signed)
History and Physical Interval Note:  03/04/2016 7:11 AM  Kelly Everett  has presented today for surgery, with the diagnosis of RIGHT HIP OA   The various methods of treatment have been discussed with the patient and family. After consideration of risks, benefits and other options for treatment, the patient has consented to  Procedure(s): TOTAL RIGHT HIP ARTHROPLASTY ANTERIOR APPROACH (Right) as a surgical intervention .  The patient's history has been reviewed, patient examined, no change in status, stable for surgery.  I have reviewed the patient's chart and labs.  Questions were answered to the patient's satisfaction.     Shelda PalLIN,Dewanna Hurston D

## 2016-03-04 NOTE — Op Note (Signed)
NAME:  Kelly Everett                ACCOUNT NO.: [id number]      MEDICAL RECORD NO.: [id number]      [facility]      PHYSICIAN:  Durene RomansLIN,Johne Buckle D  DATE OF BIRTH:  11/19/1946     DATE OF PROCEDURE:  03/04/2016                                 OPERATIVE REPORT         PREOPERATIVE DIAGNOSIS: Right  hip osteoarthritis.      POSTOPERATIVE DIAGNOSIS:  Right hip osteoarthritis.      PROCEDURE:  Right total hip replacement through an anterior approach   utilizing DePuy THR system, component size 50mm pinnacle cup, a size 32+4 neutral   Altrex liner, a size 0 Hi Tri Lock stem with a 32+1 delta ceramic   ball.      SURGEON:  Madlyn FrankelMatthew D. Charlann Boxerlin, M.D.      ASSISTANT:  Lanney GinsMatthew Babish, PA-C      ANESTHESIA:  Spinal.      SPECIMENS:  None.      COMPLICATIONS:  None.      BLOOD LOSS:  600 cc     DRAINS:  None      INDICATION OF THE PROCEDURE:  Kelly Everett is a 70 y.o. female who had   presented to office for evaluation of right hip pain.  Radiographs revealed   progressive degenerative changes with bone-on-bone   articulation to the  hip joint.  The patient had painful limited range of   motion significantly affecting their overall quality of life.  The patient was failing to    respond to conservative measures, and at this point was ready   to proceed with more definitive measures.  The patient has noted progressive   degenerative changes in his hip, progressive problems and dysfunction   with regarding the hip prior to surgery.  Consent was obtained for   benefit of pain relief.  Specific risk of infection, DVT, component   failure, dislocation, need for revision surgery, as well discussion of   the anterior versus posterior approach were reviewed.  Consent was   obtained for benefit of anterior pain relief through an anterior   approach.      PROCEDURE IN DETAIL:  The patient was brought to operative theater.   Once adequate anesthesia,  preoperative antibiotics, 2gm of Ancef, 1 gm of Tranexamic Acid, and 10 mg of Decadron administered.   The patient was positioned supine on the OSI Hanna table.  Once adequate   padding of boney process was carried out, we had predraped out the hip, and  used fluoroscopy to confirm orientation of the pelvis and position.      The right hip was then prepped and draped from proximal iliac crest to   mid thigh with shower curtain technique.      Time-out was performed identifying the patient, planned procedure, and   extremity.     An incision was then made 2 cm distal and lateral to the   anterior superior iliac spine extending over the orientation of the   tensor fascia lata muscle and sharp dissection was carried down to the   fascia of the muscle and protractor placed in the soft tissues.      The fascia  was then incised.  The muscle belly was identified and swept   laterally and retractor placed along the superior neck.  Following   cauterization of the circumflex vessels and removing some pericapsular   fat, a second cobra retractor was placed on the inferior neck.  A third   retractor was placed on the anterior acetabulum after elevating the   anterior rectus.  A L-capsulotomy was along the line of the   superior neck to the trochanteric fossa, then extended proximally and   distally.  Tag sutures were placed and the retractors were then placed   intracapsular.  We then identified the trochanteric fossa and   orientation of my neck cut, confirmed this radiographically   and then made a neck osteotomy with the femur on traction.  The femoral   head was removed without difficulty or complication.  Traction was let   off and retractors were placed posterior and anterior around the   acetabulum.      The labrum and foveal tissue were debrided.  I began reaming with a 45mm   reamer and reamed up to 49mm reamer with good bony bed preparation and a 50mm   cup was chosen.  The final 50mm  Pinnacle cup was then impacted under fluoroscopy  to confirm the depth of penetration and orientation with respect to   abduction.  A screw was placed followed by the hole eliminator.  The final   32+4 neutral Altrex liner was impacted with good visualized rim fit.  The cup was positioned anatomically within the acetabular portion of the pelvis.      At this point, the femur was rolled at 80 degrees.  Further capsule was   released off the inferior aspect of the femoral neck.  I then   released the superior capsule proximally.  The hook was placed laterally   along the femur and elevated manually and held in position with the bed   hook.  The leg was then extended and adducted with the leg rolled to 100   degrees of external rotation.  Once the proximal femur was fully   exposed, I used a box osteotome to set orientation.  I then began   broaching with the starting chili pepper broach and passed this by hand and then broached up to 0.  With the 0 broach in place I chose a high offset neck and did trial reductions.  The offset was appropriate, leg lengths   appeared to be equal best matched with the 32+1 head ball, confirmed radiographically.   Given these findings, I went ahead and dislocated the hip, repositioned all   retractors and positioned the right hip in the extended and abducted position.  The final 0 Hi Tri Lock stem was   chosen and it was impacted down to the level of neck cut.  Based on this   and the trial reduction, a 32+1 delta ceramic ball was chosen and   impacted onto a clean and dry trunnion, and the hip was reduced.  The   hip had been irrigated throughout the case again at this point.  I did   reapproximate the superior capsular leaflet to the anterior leaflet   using #1 Vicryl.  The fascia of the   tensor fascia lata muscle was then reapproximated using #1 Vicryl and #0 V-lock sutures.  The   remaining wound was closed with 2-0 Vicryl and running 4-0 Monocryl.   The hip  was cleaned, dried, and  dressed sterilely using Dermabond and   Aquacel dressing.  She was then brought   to recovery room in stable condition tolerating the procedure well.    [name], PA-C was present for the entirety of the case involved from   preoperative positioning, perioperative retractor management, general   facilitation of the case, as well as primary wound closure as assistant.            Madlyn Frankel Charlann Boxer, M.D.        03/04/2016 9:09 AM

## 2016-03-04 NOTE — Transfer of Care (Signed)
Immediate Anesthesia Transfer of Care Note  Patient: Kelly Everett  Procedure(s) Performed: Procedure(s): TOTAL RIGHT HIP ARTHROPLASTY ANTERIOR APPROACH (Right)  Patient Location: PACU  Anesthesia Type:Spinal  Level of Consciousness: awake and alert   Airway & Oxygen Therapy: Patient Spontanous Breathing and Patient connected to face mask oxygen  Post-op Assessment: Report given to RN and Post -op Vital signs reviewed and stable  Post vital signs: Reviewed and stable  Last Vitals:  Filed Vitals:   03/04/16 0513  BP: 162/89  Pulse: 84  Temp: 36.8 C  Resp: 18    Complications: No apparent anesthesia complications

## 2016-03-05 ENCOUNTER — Encounter (HOSPITAL_COMMUNITY): Payer: Self-pay | Admitting: Orthopedic Surgery

## 2016-03-05 DIAGNOSIS — E669 Obesity, unspecified: Secondary | ICD-10-CM | POA: Diagnosis present

## 2016-03-05 LAB — BASIC METABOLIC PANEL
Anion gap: 5 (ref 5–15)
BUN: 16 mg/dL (ref 6–20)
CO2: 28 mmol/L (ref 22–32)
CREATININE: 0.65 mg/dL (ref 0.44–1.00)
Calcium: 8.4 mg/dL — ABNORMAL LOW (ref 8.9–10.3)
Chloride: 111 mmol/L (ref 101–111)
GFR calc Af Amer: >60 mL/min (ref >60)
Glucose, Bld: 134 mg/dL — ABNORMAL HIGH (ref 65–99)
POTASSIUM: 4.5 mmol/L (ref 3.5–5.1)
SODIUM: 144 mmol/L (ref 135–145)

## 2016-03-05 LAB — CBC
HCT: 32 % — ABNORMAL LOW (ref 36.0–46.0)
Hemoglobin: 10.8 g/dL — ABNORMAL LOW (ref 12.0–15.0)
MCH: 31.5 pg (ref 26.0–34.0)
MCHC: 33.8 g/dL (ref 30.0–36.0)
MCV: 93.3 fL (ref 78.0–100.0)
Platelets: 177 10*3/uL (ref 150–400)
RBC: 3.43 MIL/uL — AB (ref 3.87–5.11)
RDW: 13.8 % (ref 11.5–15.5)
WBC: 11.6 10*3/uL — AB (ref 4.0–10.5)

## 2016-03-05 MED ORDER — TIZANIDINE HCL 4 MG PO TABS: 4.0000 mg | ORAL_TABLET | Freq: Four times a day (QID) | ORAL | Status: DC | PRN

## 2016-03-05 MED ORDER — DOCUSATE SODIUM 100 MG PO CAPS: 100.0000 mg | ORAL_CAPSULE | Freq: Two times a day (BID) | ORAL | Status: DC

## 2016-03-05 MED ORDER — HYDROCODONE-ACETAMINOPHEN 7.5-325 MG PO TABS: 1.0000 | ORAL_TABLET | ORAL | Status: DC | PRN

## 2016-03-05 MED ORDER — FERROUS SULFATE 325 (65 FE) MG PO TABS: 325.0000 mg | ORAL_TABLET | Freq: Three times a day (TID) | ORAL | Status: DC

## 2016-03-05 MED ORDER — ASPIRIN 325 MG PO TBEC: 325.0000 mg | DELAYED_RELEASE_TABLET | Freq: Two times a day (BID) | ORAL | Status: AC

## 2016-03-05 MED ORDER — POLYETHYLENE GLYCOL 3350 17 G PO PACK: 17.0000 g | PACK | Freq: Two times a day (BID) | ORAL | Status: DC

## 2016-03-05 NOTE — Discharge Instructions (Signed)

## 2016-03-05 NOTE — Progress Notes (Signed)
     Subjective: 1 Day Post-Op Procedure(s) (LRB): TOTAL RIGHT HIP ARTHROPLASTY ANTERIOR APPROACH (Right)   Patient reports pain as mild, pain controlled. No events throughout the night.  Ready to be discharged home if she does well with PT.  Objective:   VITALS:   Filed Vitals:   03/05/16 0230 03/05/16 0511  BP: 126/59 124/59  Pulse: 86 64  Temp: 98.1 F (36.7 C) 98 F (36.7 C)  Resp: 16 16    Dorsiflexion/Plantar flexion intact Incision: dressing C/D/I No cellulitis present Compartment soft  LABS  Recent Labs  03/05/16 0404  HGB 10.8*  HCT 32.0*  WBC 11.6*  PLT 177     Recent Labs  03/05/16 0404  NA 144  K 4.5  BUN 16  CREATININE 0.65  GLUCOSE 134*     Assessment/Plan: 1 Day Post-Op Procedure(s) (LRB): TOTAL RIGHT HIP ARTHROPLASTY ANTERIOR APPROACH (Right) Foley cath d/c'ed Advance diet Up with therapy D/C IV fluids Discharge home with home health [facility]. Follow up with OLIN,Shawnice Tilmon D in 2 weeks.  Contact information:  [facility] [address], [address] [place] [id number] [phone]    Obese (BMI 30-39.9) Estimated body mass index is 31.89 kg/(m^2) as calculated from the following:   Height as of this encounter: 5\' 3"  (1.6 m).   Weight as of this encounter: 81.647 kg (180 lb). Patient also counseled that weight may inhibit the healing process Patient counseled that losing weight will help with future health issues      Anastasio AuerbachMatthew S. Sian Joles   PAC  03/05/2016, 9:01 AM

## 2016-03-05 NOTE — Progress Notes (Signed)
[facility]    [facility] is providing the following services: rw  If patient discharges after hours, please call [phone].   Renard HamperLecretia Williamson 03/05/2016, 11:44 AM

## 2016-03-05 NOTE — Progress Notes (Signed)
Physical Therapy Treatment Patient Details Name: Kelly Everett MRN: [id number] DOB: 03/25/1946 Today's Date: 03/05/2016    History of Present Illness R DA THA    PT Comments    Reviewed with patient and spouse progression of activity, to use RW for 1 week at least and begin to try [facility] as she feels she is ready and  R hip is not too sore. Patient will not have HHPT.   Follow Up Recommendations  No PT follow up;Supervision - Intermittent     Equipment Recommendations  Rolling walker with 5" wheels    Recommendations for Other Services       Precautions / Restrictions Precautions Precautions: Fall Restrictions Weight Bearing Restrictions: No Other Position/Activity Restrictions: WBAT    Mobility  Bed Mobility Overal bed mobility: Modified Independent             General bed mobility comments: verbally reviewed to step up backwards onto step beside the bed.  Transfers Overall transfer level: Needs assistance Equipment used: Rolling walker (2 wheeled) Transfers: Sit to/from Stand Sit to Stand: Supervision         General transfer comment: cues for hand placement and R leg.  Ambulation/Gait Ambulation/Gait assistance: Supervision Ambulation Distance (Feet): 300 Feet Assistive device: Rolling walker (2 wheeled) Gait Pattern/deviations: Step-through pattern     General Gait Details: cues for sequence   Stairs Stairs: Yes Stairs assistance: Min assist Stair Management: One rail Right;Step to pattern;Forwards Number of Stairs: 4 General stair comments: spouse present for instruction  Wheelchair Mobility    Modified Rankin (Stroke Patients Only)       Balance                                    Cognition Arousal/Alertness: Awake/alert Behavior During Therapy: WFL for tasks assessed/performed Overall Cognitive Status: Within Functional Limits for tasks assessed                      Exercises Total Joint  Exercises Ankle Circles/Pumps: AROM;Left;Right;10 reps Quad Sets: AROM;Right;Left;10 reps Short Arc Quad: AROM;Left;10 reps Heel Slides: AROM;Left;10 reps Hip ABduction/ADduction: AROM;Left;10 reps Long Arc Quad: AROM;Left;10 reps    General Comments        Pertinent Vitals/Pain Pain Assessment: No/denies pain Pain Score: 1  Pain Location: R thigh Pain Descriptors / Indicators: Tender Pain Intervention(s): Premedicated before session;Repositioned    Home Living Family/patient expects to be discharged to:: Private residence Living Arrangements: Spouse/significant other Available Help at Discharge: Family Type of Home: House Home Access: Stairs to enter Entrance Stairs-Rails: Right;Left Home Layout: One level Home Equipment: Bedside commode      Prior Function Level of Independence: Independent          PT Goals (current goals can now be found in the care plan section) Acute Rehab PT Goals Patient Stated Goal: home today Progress towards PT goals: Progressing toward goals    Frequency  7X/week    PT Plan Current plan remains appropriate    Co-evaluation             End of Session   Activity Tolerance: Patient tolerated treatment well Patient left: in chair;with call bell/phone within reach     Time: 1401-1422 PT Time Calculation (min) (ACUTE ONLY): 21 min  Charges:  $Gait Training: 8-22 mins $Therapeutic Exercise: 8-22 mins  G CodesBlanchard Everett:     Kelly Everett PT [phone]  Kelly HayHill, Tavien Chestnut Everett 03/05/2016, 3:13 PM

## 2016-03-05 NOTE — Progress Notes (Signed)
Physical Therapy Treatment Patient Details Name: Kelly Everett MRN: [id number] DOB: 02/03/1946 Today's Date: 03/05/2016    History of Present Illness R DA THA    PT Comments    The patient is progressing well. Will practice steps and plans DC  Follow Up Recommendations  No PT follow up;Supervision - Intermittent     Equipment Recommendations  Rolling walker with 5" wheels    Recommendations for Other Services       Precautions / Restrictions Precautions Precautions: Fall Restrictions Weight Bearing Restrictions: No Other Position/Activity Restrictions: WBAT    Mobility  Bed Mobility Overal bed mobility: Modified Independent             General bed mobility comments: practiced x 2   Transfers Overall transfer level: Needs assistance Equipment used: Rolling walker (2 wheeled) Transfers: Sit to/from Stand Sit to Stand: Supervision         General transfer comment: cues for hand placement and R leg.  Ambulation/Gait Ambulation/Gait assistance: Supervision Ambulation Distance (Feet): 10 Feet Assistive device: Rolling walker (2 wheeled) Gait Pattern/deviations: Step-through pattern     General Gait Details: cues for sequence   Stairs            Wheelchair Mobility    Modified Rankin (Stroke Patients Only)       Balance                                    Cognition Arousal/Alertness: Awake/alert Behavior During Therapy: WFL for tasks assessed/performed Overall Cognitive Status: Within Functional Limits for tasks assessed                      Exercises Total Joint Exercises Ankle Circles/Pumps: AROM;Left;Right;10 reps Quad Sets: AROM;Right;Left;10 reps Short Arc Quad: AROM;Left;10 reps Heel Slides: AROM;Left;10 reps Hip ABduction/ADduction: AROM;Left;10 reps Long Arc Quad: AROM;Left;10 reps    General Comments        Pertinent Vitals/Pain Pain Assessment: No/denies pain Pain Score: 2  Pain Location:  r thigh Pain Descriptors / Indicators: Tender Pain Intervention(s): Premedicated before session    Home Living Family/patient expects to be discharged to:: Private residence Living Arrangements: Spouse/significant other Available Help at Discharge: Family Type of Home: House Home Access: Stairs to enter Entrance Stairs-Rails: Right;Left Home Layout: One level Home Equipment: Bedside commode      Prior Function Level of Independence: Independent          PT Goals (current goals can now be found in the care plan section) Acute Rehab PT Goals Patient Stated Goal: home today Progress towards PT goals: Progressing toward goals    Frequency  7X/week    PT Plan Current plan remains appropriate    Co-evaluation             End of Session   Activity Tolerance: Patient tolerated treatment well Patient left:  (with OT)     Time: [id number] PT Time Calculation (min) (ACUTE ONLY): 24 min  Charges:  $Gait Training: 8-22 mins $Therapeutic Exercise: 8-22 mins                    G CodesBlanchard Kelch:     Kailei Cowens PT [phone]  [name], Destyn Parfitt Elizabeth 03/05/2016, 12:51 PM

## 2016-03-05 NOTE — Progress Notes (Signed)
Occupational Therapy Evaluation Patient Details Name: Kelly Everett MRN: [id number] DOB: 08/07/1946 Today's Date: 03/05/2016    History of Present Illness R DA THA   Clinical Impression   All OT education completed and pt questions answered. No further OT needs; will sign off.    Follow Up Recommendations  No OT follow up;Supervision - Intermittent    Equipment Recommendations  None recommended by OT    Recommendations for Other Services       Precautions / Restrictions Precautions Precautions: Fall Restrictions Weight Bearing Restrictions: No Other Position/Activity Restrictions: WBAT      Mobility Bed Mobility               General bed mobility comments: NT -- OOB with PT  Transfers Overall transfer level: Needs assistance Equipment used: Rolling walker (2 wheeled) Transfers: Sit to/from Stand Sit to Stand: Supervision              Balance                                            ADL Overall ADL's : Needs assistance/impaired Eating/Feeding: Independent;Sitting   Grooming: Wash/dry hands;Supervision/safety;Standing   Upper Body Bathing: Set up;Sitting   Lower Body Bathing: Minimal assistance;Sit to/from stand   Upper Body Dressing : Set up;Sitting   Lower Body Dressing: Minimal assistance;Sit to/from stand   Toilet Transfer: Supervision/safety;Ambulation;Regular Toilet;BSC;RW   Toileting- Clothing Manipulation and Hygiene: Supervision/safety;Sitting/lateral lean       Functional mobility during ADLs: Supervision/safety;Rolling walker General ADL Comments: Patient up with PT; took patient to bathroom and practiced toileting task, groom at sink, doffed robe. To chair and end of session. Verbal review of walk-in shower transfer and patient verbalized understanding. Will have family assistance. Declined to practice. No further OT needs.     Vision     Perception     Praxis      Pertinent Vitals/Pain Pain  Assessment: No/denies pain     Hand Dominance     Extremity/Trunk Assessment Upper Extremity Assessment Upper Extremity Assessment: Overall WFL for tasks assessed   Lower Extremity Assessment Lower Extremity Assessment: Defer to PT evaluation   Cervical / Trunk Assessment Cervical / Trunk Assessment: Normal   Communication Communication Communication: No difficulties   Cognition Arousal/Alertness: Awake/alert Behavior During Therapy: WFL for tasks assessed/performed Overall Cognitive Status: Within Functional Limits for tasks assessed                     General Comments       Exercises       Shoulder Instructions      Home Living Family/patient expects to be discharged to:: Private residence Living Arrangements: Spouse/significant other Available Help at Discharge: Family Type of Home: House Home Access: Stairs to enter Secretary/administratorntrance Stairs-Number of Steps: 5 Entrance Stairs-Rails: Right;Left Home Layout: One level     Bathroom Shower/Tub: Producer, television/film/videoWalk-in shower   Bathroom Toilet: Standard Bathroom Accessibility: Yes How Accessible: Accessible via walker Home Equipment: Bedside commode          Prior Functioning/Environment Level of Independence: Independent             OT Diagnosis: Acute pain   OT Problem List: Decreased strength;Decreased range of motion;Decreased knowledge of use of DME or AE;Pain   OT Treatment/Interventions:      OT Goals(Current goals can be found in the  care plan section) Acute Rehab OT Goals Patient Stated Goal: home today OT Goal Formulation: All assessment and education complete, DC therapy  OT Frequency:     Barriers to D/C:            Co-evaluation              End of Session Equipment Utilized During Treatment: Rolling walker  Activity Tolerance: Patient tolerated treatment well Patient left: in chair;with call bell/phone within reach   Time: 1610-9604 OT Time Calculation (min): 19 min Charges:  OT  General Charges $OT Visit: 1 Procedure OT Evaluation $OT Eval Low Complexity: 1 Procedure G-Codes:    Kelly Everett 03-29-16, 12:29 PM

## 2016-03-05 NOTE — Care Management Note (Signed)
Case Management Note  Patient Details  Name: Kelly Everett MRN: [id number] Date of Birth: 1946-09-22  Subjective/Objective:                  right total hip arthroplasty, anterior approach. Action/Plan:  Expected Discharge Date:                  Expected Discharge Plan:  Home/Self Care  In-House Referral:     Discharge planning Services  CM Consult  Post Acute Care Choice:  Durable Medical Equipment Choice offered to:  Patient  DME Arranged:  Walker rolling DME Agency:  [place]:  NA [place] Agency:  NA  Status of Service:  Completed, signed off  Medicare Important Message Given:    Date Medicare IM Given:    Medicare IM give by:    Date Additional Medicare IM Given:    Additional Medicare Important Message give by:     If discussed at [place] of Stay Meetings, dates discussed:    Additional Comments: CM met with pt in room to discuss needs.  CM called [facility] DME rep, Lecretia to please deliver the rolling walker to room prior to discharge.  No follow up PT or OT recommended or ordered,  No other CM needs were communicated. Dellie Catholic, RN 03/05/2016, 3:56 PM

## 2016-03-10 NOTE — Discharge Summary (Signed)
Physician Discharge Summary  Patient ID: TYJA GORTNEY MRN: [id number] DOB/AGE: [id number] 68 y.o.  Admit date: 03/04/2016 Discharge date: 03/05/2016   Procedures:  Procedure(s) (LRB): TOTAL RIGHT HIP ARTHROPLASTY ANTERIOR APPROACH (Right)  Attending Physician:  Dr. Durene Romans   Admission Diagnoses:   Right hip primary OA / pain  Discharge Diagnoses:  Principal Problem:   S/P right THA, AA Active Problems:   Obese  Past Medical History  Diagnosis Date  . Hypertension   . Hypothyroidism   . Depression   . Arthritis     OA    HPI:    Kelly Everett, 70 y.o. female, has a history of pain and functional disability in the right hip(s) due to arthritis and patient has failed non-surgical conservative treatments for greater than 12 weeks to include NSAID's and/or analgesics, corticosteriod injections and activity modification. Onset of symptoms was gradual starting 3+ years ago with gradually worsening course since that time.The patient noted no past surgery on the right hip(s). Patient currently rates pain in the right hip at 8 out of 10 with activity. Patient has night pain, worsening of pain with activity and weight bearing, trendelenberg gait, pain that interfers with activities of daily living and pain with passive range of motion. Patient has evidence of periarticular osteophytes and joint space narrowing by imaging studies. This condition presents safety issues increasing the risk of falls. There is no current active infection. Risks, benefits and expectations were discussed with the patient. Risks including but not limited to the risk of anesthesia, blood clots, nerve damage, blood vessel damage, failure of the prosthesis, infection and up to and including death. Patient understand the risks, benefits and expectations and wishes to proceed with surgery.   PCP: Valla Leaver, MD   Discharged Condition: good  Hospital Course:  Patient underwent the  above stated procedure on 03/04/2016. Patient tolerated the procedure well and brought to the recovery room in good condition and subsequently to the floor.  POD #1 BP: 124/59 ; Pulse: 64 ; Temp: 98 F (36.7 C) ; Resp: 16 Patient reports pain as mild, pain controlled. No events throughout the night. Ready to be discharged home. Dorsiflexion/plantar flexion intact, incision: dressing C/D/I, no cellulitis present and compartment soft.   LABS  Basename    HGB     10.8  HCT     32.0    Discharge Exam: General appearance: alert, cooperative and no distress Extremities: Homans sign is negative, no sign of DVT, no edema, redness or tenderness in the calves or thighs and no ulcers, gangrene or trophic changes  Disposition: Home with follow up in 2 weeks   Follow-up Information    Follow up with [name], MD. Schedule an appointment as soon as possible for a visit in 2 weeks.   Specialty:  Orthopedic Surgery   Contact information:   [address] [place] [address] [phone]       Follow up with [facility].   Why:  rolling walker   Contact information:   [address] [place] [address] [phone]       Discharge Instructions    Call MD / Call 911    Complete by:  As directed   If you experience chest pain or shortness of breath, CALL 911 and be transported to the hospital emergency room.  If you develope a fever above 101 F, pus (white drainage) or increased drainage or redness at the wound,  or calf pain, call your surgeon's office.     Change dressing    Complete by:  As directed   Maintain surgical dressing until follow up in the clinic. If the edges start to pull up, may reinforce with tape. If the dressing is no longer working, may remove and cover with gauze and tape, but must keep the area dry and clean.  Call with any questions or concerns.     Constipation Prevention    Complete by:  As directed   Drink plenty  of fluids.  Prune juice may be helpful.  You may use a stool softener, such as Colace (over the counter) 100 mg twice a day.  Use MiraLax (over the counter) for constipation as needed.     Diet - low sodium heart healthy    Complete by:  As directed      Discharge instructions    Complete by:  As directed   Maintain surgical dressing until follow up in the clinic. If the edges start to pull up, may reinforce with tape. If the dressing is no longer working, may remove and cover with gauze and tape, but must keep the area dry and clean.  Follow up in 2 weeks at [facility]. Call with any questions or concerns.     Increase activity slowly as tolerated    Complete by:  As directed   Weight bearing as tolerated with assist device (walker, cane, etc) as directed, use it as long as suggested by your surgeon or therapist, typically at least 4-6 weeks.     TED hose    Complete by:  As directed   Use stockings (TED hose) for 2 weeks on both leg(s).  You may remove them at night for sleeping.             Medication List    STOP taking these medications        diclofenac 75 MG EC tablet  Commonly known as:  VOLTAREN      TAKE these medications        amLODipine 5 MG tablet  Commonly known as:  NORVASC  Take 5 mg by mouth at bedtime.     aspirin 325 MG EC tablet  Take 1 tablet (325 mg total) by mouth 2 (two) times daily.     docusate sodium 100 MG capsule  Commonly known as:  COLACE  Take 1 capsule (100 mg total) by mouth 2 (two) times daily.     DULoxetine 60 MG capsule  Commonly known as:  CYMBALTA  Take 60 mg by mouth at bedtime.     ESTER-C 500-200-60 MG Tabs  Take 1 tablet by mouth 2 (two) times daily.     ferrous sulfate 325 (65 FE) MG tablet  Take 1 tablet (325 mg total) by mouth 3 (three) times daily after meals.     HYDROcodone-acetaminophen 7.5-325 MG tablet  Commonly known as:  NORCO  Take 1-2 tablets by mouth every 4 (four) hours as needed for moderate  pain.     levothyroxine 88 MCG tablet  Commonly known as:  SYNTHROID, LEVOTHROID  Take 88 mcg by mouth daily before breakfast.     losartan-hydrochlorothiazide 100-25 MG tablet  Commonly known as:  HYZAAR  Take 1 tablet by mouth every morning.     metroNIDAZOLE 0.75 % cream  Commonly known as:  METROCREAM  Apply 1 application topically 2 (two) times daily.     polyethylene glycol packet  Commonly known  as:  MIRALAX / GLYCOLAX  Take 17 g by mouth 2 (two) times daily.     rosuvastatin 10 MG tablet  Commonly known as:  CRESTOR  Take 10 mg by mouth at bedtime.     tiZANidine 4 MG tablet  Commonly known as:  ZANAFLEX  Take 1 tablet (4 mg total) by mouth every 6 (six) hours as needed for muscle spasms.         Signed: Anastasio AuerbachMatthew S. Darreld Hoffer   PA-C  03/10/2016, 12:46 PM

## 2016-06-05 ENCOUNTER — Encounter (HOSPITAL_COMMUNITY)
Admission: RE | Admit: 2016-06-05 | Discharge: 2016-06-05 | Disposition: A | Discharge status: Home / Self Care | Payer: Medicare Other | Source: Ambulatory Visit | Attending: Orthopedic Surgery | Admitting: Orthopedic Surgery

## 2016-06-05 ENCOUNTER — Encounter (HOSPITAL_COMMUNITY): Payer: Self-pay

## 2016-06-05 DIAGNOSIS — I10 Essential (primary) hypertension: Secondary | ICD-10-CM | POA: Insufficient documentation

## 2016-06-05 DIAGNOSIS — E039 Hypothyroidism, unspecified: Secondary | ICD-10-CM | POA: Diagnosis not present

## 2016-06-05 DIAGNOSIS — Z96641 Presence of right artificial hip joint: Secondary | ICD-10-CM | POA: Insufficient documentation

## 2016-06-05 DIAGNOSIS — Z01812 Encounter for preprocedural laboratory examination: Secondary | ICD-10-CM | POA: Insufficient documentation

## 2016-06-05 HISTORY — DX: Anxiety disorder, unspecified: F41.9

## 2016-06-05 HISTORY — DX: Presence of spectacles and contact lenses: Z97.3

## 2016-06-05 HISTORY — DX: Tinnitus, unspecified ear: H93.19

## 2016-06-05 LAB — BASIC METABOLIC PANEL
Anion gap: 5 (ref 5–15)
BUN: 16 mg/dL (ref 6–20)
CALCIUM: 9.4 mg/dL (ref 8.9–10.3)
CHLORIDE: 107 mmol/L (ref 101–111)
CO2: 29 mmol/L (ref 22–32)
CREATININE: 0.67 mg/dL (ref 0.44–1.00)
Glucose, Bld: 115 mg/dL — ABNORMAL HIGH (ref 65–99)
Potassium: 3.6 mmol/L (ref 3.5–5.1)
SODIUM: 141 mmol/L (ref 135–145)

## 2016-06-05 LAB — TYPE AND SCREEN
ABO/RH(D): A POS
Antibody Screen: NEGATIVE

## 2016-06-05 LAB — PROTIME-INR
INR: 1.02 (ref 0.00–1.49)
Prothrombin Time: 13.2 s (ref 11.6–15.2)

## 2016-06-05 LAB — CBC
HCT: 44.1 % (ref 36.0–46.0)
Hemoglobin: 13.9 g/dL (ref 12.0–15.0)
MCH: 29.3 pg (ref 26.0–34.0)
MCHC: 31.5 g/dL (ref 30.0–36.0)
MCV: 92.8 fL (ref 78.0–100.0)
Platelets: 183 K/uL (ref 150–400)
RBC: 4.75 MIL/uL (ref 3.87–5.11)
RDW: 12.9 % (ref 11.5–15.5)
WBC: 5.1 K/uL (ref 4.0–10.5)

## 2016-06-05 LAB — SURGICAL PCR SCREEN
MRSA, PCR: INVALID — AB
STAPHYLOCOCCUS AUREUS: INVALID — AB

## 2016-06-05 NOTE — Progress Notes (Signed)
Note pertaining to right lower leg wound faxed to Dr Charlann Boxerlin. Pt due to see surgeon today 06/05/2016.  CXR report from 06/29/2015 per chart

## 2016-06-05 NOTE — Patient Instructions (Signed)
Kelly Everett  06/05/2016   Your procedure is scheduled on: Monday June 16, 2016  [facility]  [facility]  [facility]ators to 3rd floor to  Short Stay Center at 12:00 PM.  Call this number if you have problems the morning of surgery [phone]   Remember: ONLY 1 PERSON MAY GO WITH YOU TO SHORT STAY TO GET  READY MORNING OF YOUR SURGERY.  Do not eat food After Midnight but may take clear liquid diet till 8:00 am day of surgery then nothing by mouth.      Take these medicines the morning of surgery with A SIP OF WATER: Levothyroxine                               You may not have any metal on your body including hair pins and              piercings  Do not wear jewelry, make-up, lotions, powders or perfumes, deodorant             Do not wear nail polish.  Do not shave  48 hours prior to surgery.               Do not bring valuables to the hospital. [facility] IS NOT             RESPONSIBLE   FOR VALUABLES.  Contacts, dentures or bridgework may not be worn into surgery.  Leave suitcase in the car. After surgery it may be brought to your room.              Please read over the following fact sheets you were given:MRSA INFORMATION SHEET; INCENTIVE SPIROMETER; BLOOD TRANSFUSION INFORMATION SHEET  _____________________________________________________________________             [facility] - Pre[facility] Before surgery, you can play an important role.  Because skin is not sterile, your skin needs to be as free of germs as possible.  You can reduce the number of germs on your skin by washing with CHG (chlorahexidine gluconate) soap before surgery.  CHG is an antiseptic cleaner which kills germs and bonds with the skin to continue killing germs even after washing. Please DO NOT use if you have an allergy to CHG or antibacterial soaps.  If your skin becomes reddened/irritated stop using the CHG and inform your nurse when you arrive at  Short Stay. Do not shave (including legs and underarms) for at least 48 hours prior to the first CHG shower.  You may shave your face/neck. Please follow these instructions carefully:  1.  Shower with CHG Soap the night before surgery and the  morning of Surgery.  2.  If you choose to wash your hair, wash your hair first as usual with your  normal  shampoo.  3.  After you shampoo, rinse your hair and body thoroughly to remove the  shampoo.                           4.  Use CHG as you would any other liquid soap.  You can apply chg directly  to the skin and wash  Gently with a scrungie or clean washcloth.  5.  Apply the CHG Soap to your body ONLY FROM THE NECK DOWN.   Do not use on face/ open                           Wound or open sores. Avoid contact with eyes, ears mouth and genitals (private parts).                       Wash face,  Genitals (private parts) with your normal soap.             6.  Wash thoroughly, paying special attention to the area where your surgery  will be performed.  7.  Thoroughly rinse your body with warm water from the neck down.  8.  DO NOT shower/wash with your normal soap after using and rinsing off  the CHG Soap.                9.  Pat yourself dry with a clean towel.            10.  Wear clean pajamas.            11.  Place clean sheets on your bed the night of your first shower and do not  sleep with pets. Day of Surgery : Do not apply any lotions/deodorants the morning of surgery.  Please wear clean clothes to the hospital/surgery center.  FAILURE TO FOLLOW THESE INSTRUCTIONS MAY RESULT IN THE CANCELLATION OF YOUR SURGERY PATIENT SIGNATURE_________________________________  NURSE SIGNATURE__________________________________  ________________________________________________________________________    CLEAR LIQUID DIET   Foods Allowed                                                                     Foods Excluded  Coffee and tea,  regular and decaf                             liquids that you cannot  Plain Jell-O in any flavor                                             see through such as: Fruit ices (not with fruit pulp)                                     milk, soups, orange juice  Iced Popsicles                                    All solid food Carbonated beverages, regular and diet                                    Cranberry, grape and apple juices Sports drinks like Gatorade Lightly seasoned clear broth or consume(fat free) Sugar, honey syrup  Sample Menu Breakfast                                Lunch                                     Supper Cranberry juice                    Beef broth                            Chicken broth Jell-O                                     Grape juice                           Apple juice Coffee or tea                        Jell-O                                      Popsicle                                                Coffee or tea                        Coffee or tea  _____________________________________________________________________    Incentive Spirometer  An incentive spirometer is a tool that can help keep your lungs clear and active. This tool measures how well you are filling your lungs with each breath. Taking long deep breaths may help reverse or decrease the chance of developing breathing (pulmonary) problems (especially infection) following:  A long period of time when you are unable to move or be active. BEFORE THE PROCEDURE   If the spirometer includes an indicator to show your best effort, your nurse or respiratory therapist will set it to a desired goal.  If possible, sit up straight or lean slightly forward. Try not to slouch.  Hold the incentive spirometer in an upright position. INSTRUCTIONS FOR USE   Sit on the edge of your bed if possible, or sit up as far as you can in bed or on a chair.  Hold the incentive spirometer in an upright  position.  Breathe out normally.  Place the mouthpiece in your mouth and seal your lips tightly around it.  Breathe in slowly and as deeply as possible, raising the piston or the ball toward the top of the column.  Hold your breath for 3-5 seconds or for as long as possible. Allow the piston or ball to fall to the bottom of the column.  Remove the mouthpiece from your mouth and breathe out normally.  Rest for a few seconds and repeat Steps 1 through 7 at least 10 times every 1-2 hours when you are awake. Take your time and take a few normal breaths between  deep breaths.  The spirometer may include an indicator to show your best effort. Use the indicator as a goal to work toward during each repetition.  After each set of 10 deep breaths, practice coughing to be sure your lungs are clear. If you have an incision (the cut made at the time of surgery), support your incision when coughing by placing a pillow or rolled up towels firmly against it. Once you are able to get out of bed, walk around indoors and cough well. You may stop using the incentive spirometer when instructed by your caregiver.  RISKS AND COMPLICATIONS  Take your time so you do not get dizzy or light-headed.  If you are in pain, you may need to take or ask for pain medication before doing incentive spirometry. It is harder to take a deep breath if you are having pain. AFTER USE  Rest and breathe slowly and easily.  It can be helpful to keep track of a log of your progress. Your caregiver can provide you with a simple table to help with this. If you are using the spirometer at home, follow these instructions: [place] IF:   You are having difficultly using the spirometer.  You have trouble using the spirometer as often as instructed.  Your pain medication is not giving enough relief while using the spirometer.  You develop fever of 100.5 F (38.1 C) or higher. SEEK IMMEDIATE MEDICAL CARE IF:   You cough  up bloody sputum that had not been present before.  You develop fever of 102 F (38.9 C) or greater.  You develop worsening pain at or near the incision site. MAKE SURE YOU:   Understand these instructions.  Will watch your condition.  Will get help right away if you are not doing well or get worse. Document Released: 03/23/2007 Document Revised: 02/02/2012 Document Reviewed: 05/24/2007 Exit[employer] Patient Information 2014 [employer], [place].   ________________________________________________________________________  WHAT IS A BLOOD TRANSFUSION? Blood Transfusion Information  A transfusion is the replacement of blood or some of its parts. Blood is made up of multiple cells which provide different functions.  Red blood cells carry oxygen and are used for blood loss replacement.  White blood cells fight against infection.  Platelets control bleeding.  Plasma helps clot blood.  Other blood products are available for specialized needs, such as hemophilia or other clotting disorders. BEFORE THE TRANSFUSION  Who gives blood for transfusions?   Healthy volunteers who are fully evaluated to make sure their blood is safe. This is blood bank blood. Transfusion therapy is the safest it has ever been in the practice of medicine. Before blood is taken from a donor, a complete history is taken to make sure that person has no history of diseases nor engages in risky social behavior (examples are intravenous drug use or sexual activity with multiple partners). The donor's travel history is screened to minimize risk of transmitting infections, such as malaria. The donated blood is tested for signs of infectious diseases, such as HIV and hepatitis. The blood is then tested to be sure it is compatible with you in order to minimize the chance of a transfusion reaction. If you or a relative donates blood, this is often done in anticipation of surgery and is not appropriate for emergency situations. It takes  many days to process the donated blood. RISKS AND COMPLICATIONS Although transfusion therapy is very safe and saves many lives, the main dangers of transfusion include:   Getting an infectious disease.  Developing a transfusion reaction. This is an allergic reaction to something in the blood you were given. Every precaution is taken to prevent this. The decision to have a blood transfusion has been considered carefully by your caregiver before blood is given. Blood is not given unless the benefits outweigh the risks. AFTER THE TRANSFUSION  Right after receiving a blood transfusion, you will usually feel much better and more energetic. This is especially true if your red blood cells have gotten low (anemic). The transfusion raises the level of the red blood cells which carry oxygen, and this usually causes an energy increase.  The nurse administering the transfusion will monitor you carefully for complications. HOME CARE INSTRUCTIONS  No special instructions are needed after a transfusion. You may find your energy is better. Speak with your caregiver about any limitations on activity for underlying diseases you may have. SEEK MEDICAL CARE IF:   Your condition is not improving after your transfusion.  You develop redness or irritation at the intravenous (IV) site. SEEK IMMEDIATE MEDICAL CARE IF:  Any of the following symptoms occur over the next 12 hours:  Shaking chills.  You have a temperature by mouth above 102 F (38.9 C), not controlled by medicine.  Chest, back, or muscle pain.  People around you feel you are not acting correctly or are confused.  Shortness of breath or difficulty breathing.  Dizziness and fainting.  You get a rash or develop hives.  You have a decrease in urine output.  Your urine turns a dark color or changes to pink, red, or brown. Any of the following symptoms occur over the next 10 days:  You have a temperature by mouth above 102 F (38.9 C), not  controlled by medicine.  Shortness of breath.  Weakness after normal activity.  The white part of the eye turns yellow (jaundice).  You have a decrease in the amount of urine or are urinating less often.  Your urine turns a dark color or changes to pink, red, or brown. Document Released: 11/07/2000 Document Revised: 02/02/2012 Document Reviewed: 06/26/2008 [facility] Patient Information 2014 [place], [place].  _______________________________________________________________________

## 2016-06-05 NOTE — Progress Notes (Signed)
Pt states has abrasive area to lower right leg. Cover with bandage. [place]. Was given to Rocephin injections and oral antibiotic given - pt states has completed prescription.

## 2016-06-07 LAB — MRSA CULTURE: Culture: NO GROWTH

## 2016-06-09 NOTE — H&P (Signed)
TOTAL HIP REVISION ADMISSION H&P  Patient is admitted for right revision total hip arthroplasty, femoral component.  Subjective:  Chief Complaint: Right hip pain s/p THA  HPI: Kelly Everett, 70 y.o. 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 The indications for the revision total hip arthroplasty are subsidence of the femoral component.  Onset of symptoms was abrupt starting 3-4 months ago with rapidlly worsening course since that time.  Prior procedures on the right hip include arthroplasty.  Patient currently rates pain in the right hip at 9 out of 10 with activity.  There is night pain, worsening of pain with activity and weight bearing, trendelenberg gait, pain that interfers with activities of daily living and pain with passive range of motion. Patient has evidence of subsidence of femoral component by imaging studies.  This condition presents safety issues increasing the risk of falls.   There is no current active infection.   Risks, benefits and expectations were discussed with the patient.  Risks including but not limited to the risk of anesthesia, blood clots, nerve damage, blood vessel damage, failure of the prosthesis, infection and up to and including death.  Patient understand the risks, benefits and expectations and wishes to proceed with surgery.   PCP: Valla Leaver, MD  D/C Plans:      Home  Post-op Meds:       No Rx given  Tranexamic Acid:      To be given - IV   Decadron:      Is to be given  FYI:     ASA  Norco    Patient Active Problem List   Diagnosis Date Noted  . Obese 03/05/2016  . 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  Hypertension   . Hypothyroidism   . Depression   . Arthritis     OA  . Wears glasses   . Tinnitus   . Anxiety     Past Surgical History  Procedure Laterality Date  . Cesarean section  1974 AND 1980  . Surgery for endometriossis  1972  . Abdominal hysterectomy  2000    COMPLETE  . Complete thyroidectomy  2016  . Cholecystectomy  2016  . Thumb surgery Bilateral YRS AGO  . Total hip arthroplasty Right 03/04/2016    Procedure: TOTAL RIGHT HIP ARTHROPLASTY ANTERIOR APPROACH;  Surgeon: Durene Romans, MD;  Location: [facility];  Service: Orthopedics;  Laterality: Right;    No prescriptions prior to admission   No Known Allergies   Social History  Substance Use Topics  . Smoking status: Former Smoker -- 0.25 packs/day for 20 years    Types: Cigarettes    Quit date: 11/25/1983  . Smokeless tobacco: Never Used  . Alcohol Use: Yes     Comment: OCCASIONAL        Review of Systems  Constitutional: Negative.   HENT: Positive for tinnitus.   Eyes: Negative.   Respiratory: Negative.   Cardiovascular: Negative.   Gastrointestinal: Negative.   Genitourinary: Negative.   Musculoskeletal: Positive for joint pain.  Skin: Negative.   Neurological: Negative.   Endo/Heme/Allergies: Negative.   Psychiatric/Behavioral: Positive for depression. The patient is nervous/anxious.     Objective:  Physical Exam  Constitutional: She is oriented to person, place, and time. She appears well-developed.  HENT:  Head: Normocephalic.  Eyes: Pupils are equal, round, and reactive to light.  Neck: Neck supple. No JVD present. No tracheal deviation present. No thyromegaly present.  Cardiovascular: Normal rate, regular rhythm, normal heart sounds and intact distal pulses.   Respiratory: Effort normal and breath sounds normal. No stridor. No respiratory distress. She has no wheezes.  GI: Soft. There is no tenderness. There is no guarding.  Musculoskeletal:       Right hip: She exhibits decreased range of  motion, decreased strength, tenderness, bony tenderness and laceration (healed previous incision). She exhibits no crepitus and no deformity.  Lymphadenopathy:    She has no cervical adenopathy.  Neurological: She is alert and oriented to person, place, and time.  Skin: Skin is warm and dry.  Psychiatric: She has a normal mood and affect.      Labs:  Estimated body mass index is 31.89 kg/(m^2) as calculated from the following:   Height as of 03/04/16: 5\' 3"  (1.6 m).   Weight as of 02/19/16: 81.647 kg (180 lb).  Imaging Review:  Plain radiographs demonstrate previous THA of the right hip(s). There is evidence of loosening of the femoral stem.The bone quality appears to be good for age and reported activity level. There is subsidence of the femoral component.  Assessment/Plan:  Right hip with failed previous arthroplasty.  The patient history, physical examination, clinical judgement of the provider and imaging studies are consistent with failure of the right hip(s), previous total hip arthroplasty. Revision total hip arthroplasty is deemed medically necessary. The treatment options including medical management, injection therapy, arthroscopy and arthroplasty were discussed at length. The risks and benefits of total hip arthroplasty were presented and reviewed. The risks due to aseptic loosening, infection, stiffness, dislocation/subluxation,  thromboembolic complications and other imponderables were discussed.  The patient acknowledged the explanation, agreed to proceed with the plan and consent was signed. Patient is being admitted for inpatient treatment for surgery, pain control, PT, OT, prophylactic antibiotics, VTE prophylaxis, progressive ambulation and ADL's and discharge planning. The patient is planning to be discharged home.     Anastasio AuerbachMatthew S. Anabela Crayton   PA-C  06/09/2016, 10:12 PM

## 2016-06-10 NOTE — Progress Notes (Signed)
Pt aware of surgical time change. [facility] on 06/16/2016. Verbalized understanding of no food or drink after midnight.

## 2016-06-16 ENCOUNTER — Inpatient Hospital Stay (HOSPITAL_COMMUNITY): Payer: Medicare Other

## 2016-06-16 ENCOUNTER — Inpatient Hospital Stay (HOSPITAL_COMMUNITY)
Admission: RE | Admit: 2016-06-16 | Discharge: 2016-06-17 | DRG: 468 | Disposition: A | Discharge status: Home / Self Care | Payer: Medicare Other | Source: Ambulatory Visit | Attending: Orthopedic Surgery | Admitting: Orthopedic Surgery

## 2016-06-16 ENCOUNTER — Encounter (HOSPITAL_COMMUNITY)
Admission: RE | Disposition: A | Discharge status: Home / Self Care | Payer: Self-pay | Source: Ambulatory Visit | Attending: Orthopedic Surgery

## 2016-06-16 ENCOUNTER — Inpatient Hospital Stay (HOSPITAL_COMMUNITY): Payer: Medicare Other | Admitting: Certified Registered Nurse Anesthetist

## 2016-06-16 ENCOUNTER — Encounter (HOSPITAL_COMMUNITY): Payer: Self-pay | Admitting: *Deleted

## 2016-06-16 DIAGNOSIS — Z9049 Acquired absence of other specified parts of digestive tract: Secondary | ICD-10-CM

## 2016-06-16 DIAGNOSIS — F329 Major depressive disorder, single episode, unspecified: Secondary | ICD-10-CM | POA: Diagnosis present

## 2016-06-16 DIAGNOSIS — I10 Essential (primary) hypertension: Secondary | ICD-10-CM | POA: Diagnosis present

## 2016-06-16 DIAGNOSIS — E669 Obesity, unspecified: Secondary | ICD-10-CM | POA: Diagnosis present

## 2016-06-16 DIAGNOSIS — E039 Hypothyroidism, unspecified: Secondary | ICD-10-CM | POA: Diagnosis present

## 2016-06-16 DIAGNOSIS — F419 Anxiety disorder, unspecified: Secondary | ICD-10-CM | POA: Diagnosis present

## 2016-06-16 DIAGNOSIS — Y792 Prosthetic and other implants, materials and accessory orthopedic devices associated with adverse incidents: Secondary | ICD-10-CM | POA: Diagnosis present

## 2016-06-16 DIAGNOSIS — T84090A Other mechanical complication of internal right hip prosthesis, initial encounter: Secondary | ICD-10-CM | POA: Diagnosis present

## 2016-06-16 DIAGNOSIS — Z87891 Personal history of nicotine dependence: Secondary | ICD-10-CM | POA: Diagnosis not present

## 2016-06-16 DIAGNOSIS — Z90710 Acquired absence of both cervix and uterus: Secondary | ICD-10-CM

## 2016-06-16 DIAGNOSIS — H9319 Tinnitus, unspecified ear: Secondary | ICD-10-CM | POA: Diagnosis present

## 2016-06-16 DIAGNOSIS — M25551 Pain in right hip: Secondary | ICD-10-CM | POA: Diagnosis present

## 2016-06-16 DIAGNOSIS — Z6831 Body mass index (BMI) 31.0-31.9, adult: Secondary | ICD-10-CM

## 2016-06-16 DIAGNOSIS — Z96649 Presence of unspecified artificial hip joint: Secondary | ICD-10-CM

## 2016-06-16 HISTORY — PX: ANTERIOR HIP REVISION: SHX6527

## 2016-06-16 LAB — HEMOGLOBIN AND HEMATOCRIT, BLOOD
HEMATOCRIT: 29.3 % — AB (ref 36.0–46.0)
HEMOGLOBIN: 9.4 g/dL — AB (ref 12.0–15.0)

## 2016-06-16 SURGERY — REVISION, TOTAL ARTHROPLASTY, HIP, ANTERIOR APPROACH
Anesthesia: Monitor Anesthesia Care | Site: Hip | Laterality: Right

## 2016-06-16 MED ORDER — PROPOFOL 10 MG/ML IV BOLUS
INTRAVENOUS | Status: AC
  Filled 2016-06-16: qty 40

## 2016-06-16 MED ORDER — METOCLOPRAMIDE HCL 5 MG/ML IJ SOLN: 5.0000 mg | Freq: Three times a day (TID) | INTRAMUSCULAR | Status: DC | PRN

## 2016-06-16 MED ORDER — CEFAZOLIN SODIUM-DEXTROSE 2-4 GM/100ML-% IV SOLN
INTRAVENOUS | Status: AC
  Filled 2016-06-16: qty 100

## 2016-06-16 MED ORDER — CEFAZOLIN SODIUM-DEXTROSE 2-4 GM/100ML-% IV SOLN
2.0000 g | INTRAVENOUS | Status: AC
  Administered 2016-06-16: 2 g via INTRAVENOUS
  Filled 2016-06-16: qty 100

## 2016-06-16 MED ORDER — FERROUS SULFATE 325 (65 FE) MG PO TABS
325.0000 mg | ORAL_TABLET | Freq: Three times a day (TID) | ORAL | Status: DC
  Administered 2016-06-16 – 2016-06-17 (×3): 325 mg via ORAL
  Filled 2016-06-16 (×3): qty 1

## 2016-06-16 MED ORDER — DEXAMETHASONE SODIUM PHOSPHATE 10 MG/ML IJ SOLN
INTRAMUSCULAR | Status: DC | PRN
  Administered 2016-06-16 (×2): 10 mg via INTRAVENOUS

## 2016-06-16 MED ORDER — PHENYLEPHRINE 40 MCG/ML (10ML) SYRINGE FOR IV PUSH (FOR BLOOD PRESSURE SUPPORT)
PREFILLED_SYRINGE | INTRAVENOUS | Status: AC
  Filled 2016-06-16: qty 10

## 2016-06-16 MED ORDER — LOSARTAN POTASSIUM-HCTZ 100-25 MG PO TABS: 1.0000 | ORAL_TABLET | Freq: Every morning | ORAL | Status: DC

## 2016-06-16 MED ORDER — MENTHOL 3 MG MT LOZG: 1.0000 | LOZENGE | OROMUCOSAL | Status: DC | PRN

## 2016-06-16 MED ORDER — DEXAMETHASONE SODIUM PHOSPHATE 10 MG/ML IJ SOLN
10.0000 mg | Freq: Once | INTRAMUSCULAR | Status: AC
  Administered 2016-06-17: 10 mg via INTRAVENOUS
  Filled 2016-06-16: qty 1

## 2016-06-16 MED ORDER — LOSARTAN POTASSIUM 50 MG PO TABS
100.0000 mg | ORAL_TABLET | Freq: Every day | ORAL | Status: DC
  Administered 2016-06-17: 100 mg via ORAL
  Filled 2016-06-16: qty 2

## 2016-06-16 MED ORDER — STERILE WATER FOR IRRIGATION IR SOLN
Status: DC | PRN
  Administered 2016-06-16: 3000 mL

## 2016-06-16 MED ORDER — PHENOL 1.4 % MT LIQD: 1.0000 | OROMUCOSAL | Status: DC | PRN

## 2016-06-16 MED ORDER — LIDOCAINE HCL (CARDIAC) 20 MG/ML IV SOLN
INTRAVENOUS | Status: DC | PRN
  Administered 2016-06-16: 50 mg via INTRAVENOUS

## 2016-06-16 MED ORDER — SODIUM CHLORIDE 0.9 % IV SOLN
100.0000 mL/h | INTRAVENOUS | Status: DC
  Administered 2016-06-16 – 2016-06-17 (×2): 100 mL/h via INTRAVENOUS
  Filled 2016-06-16 (×5): qty 1000

## 2016-06-16 MED ORDER — METHOCARBAMOL 500 MG PO TABS: 500.0000 mg | ORAL_TABLET | Freq: Four times a day (QID) | ORAL | Status: DC | PRN

## 2016-06-16 MED ORDER — PROPOFOL 10 MG/ML IV BOLUS
INTRAVENOUS | Status: DC | PRN
  Administered 2016-06-16: 120 mg via INTRAVENOUS
  Administered 2016-06-16 (×2): 40 mg via INTRAVENOUS
  Administered 2016-06-16: 30 mg via INTRAVENOUS
  Administered 2016-06-16: 40 mg via INTRAVENOUS
  Administered 2016-06-16: 10 mg via INTRAVENOUS

## 2016-06-16 MED ORDER — SODIUM CHLORIDE 0.9 % IV SOLN
1000.0000 mg | INTRAVENOUS | Status: AC
  Administered 2016-06-16: 1000 mg via INTRAVENOUS
  Filled 2016-06-16: qty 10

## 2016-06-16 MED ORDER — PROPOFOL 500 MG/50ML IV EMUL
INTRAVENOUS | Status: DC | PRN
  Administered 2016-06-16: 25 ug/kg/min via INTRAVENOUS

## 2016-06-16 MED ORDER — TRANEXAMIC ACID 1000 MG/10ML IV SOLN
1000.0000 mg | Freq: Once | INTRAVENOUS | Status: AC
  Administered 2016-06-16: 1000 mg via INTRAVENOUS
  Filled 2016-06-16: qty 10

## 2016-06-16 MED ORDER — OXYCODONE HCL 5 MG/5ML PO SOLN
5.0000 mg | Freq: Once | ORAL | Status: DC | PRN
  Filled 2016-06-16: qty 5

## 2016-06-16 MED ORDER — CELECOXIB 200 MG PO CAPS
200.0000 mg | ORAL_CAPSULE | Freq: Two times a day (BID) | ORAL | Status: DC
  Administered 2016-06-16 – 2016-06-17 (×2): 200 mg via ORAL
  Filled 2016-06-16 (×2): qty 1

## 2016-06-16 MED ORDER — BUPIVACAINE IN DEXTROSE 0.75-8.25 % IT SOLN
INTRATHECAL | Status: DC | PRN
  Administered 2016-06-16: 1.8 mL via INTRATHECAL

## 2016-06-16 MED ORDER — ONDANSETRON HCL 4 MG/2ML IJ SOLN: 4.0000 mg | Freq: Four times a day (QID) | INTRAMUSCULAR | Status: DC | PRN

## 2016-06-16 MED ORDER — PHENYLEPHRINE HCL 10 MG/ML IJ SOLN
INTRAMUSCULAR | Status: AC
  Filled 2016-06-16: qty 1

## 2016-06-16 MED ORDER — HYDROMORPHONE HCL 1 MG/ML IJ SOLN: 0.5000 mg | INTRAMUSCULAR | Status: DC | PRN

## 2016-06-16 MED ORDER — ACETAMINOPHEN 10 MG/ML IV SOLN
INTRAVENOUS | Status: AC
  Filled 2016-06-16: qty 100

## 2016-06-16 MED ORDER — ONDANSETRON HCL 4 MG PO TABS: 4.0000 mg | ORAL_TABLET | Freq: Four times a day (QID) | ORAL | Status: DC | PRN

## 2016-06-16 MED ORDER — DIPHENHYDRAMINE HCL 25 MG PO CAPS: 25.0000 mg | ORAL_CAPSULE | Freq: Four times a day (QID) | ORAL | Status: DC | PRN

## 2016-06-16 MED ORDER — ACETAMINOPHEN 10 MG/ML IV SOLN: 1000.0000 mg | Freq: Once | INTRAVENOUS | Status: DC

## 2016-06-16 MED ORDER — ALBUMIN HUMAN 5 % IV SOLN
INTRAVENOUS | Status: AC
  Filled 2016-06-16: qty 250

## 2016-06-16 MED ORDER — POLYETHYLENE GLYCOL 3350 17 G PO PACK
17.0000 g | PACK | Freq: Two times a day (BID) | ORAL | Status: DC
  Administered 2016-06-17: 17 g via ORAL
  Filled 2016-06-16: qty 1

## 2016-06-16 MED ORDER — SODIUM CHLORIDE 0.9 % IR SOLN
Status: DC | PRN
  Administered 2016-06-16: 1000 mL

## 2016-06-16 MED ORDER — GLYCOPYRROLATE 0.2 MG/ML IJ SOLN
INTRAMUSCULAR | Status: AC
  Filled 2016-06-16: qty 1

## 2016-06-16 MED ORDER — METHOCARBAMOL 1000 MG/10ML IJ SOLN
500.0000 mg | Freq: Four times a day (QID) | INTRAVENOUS | Status: DC | PRN
  Administered 2016-06-16: 500 mg via INTRAVENOUS
  Filled 2016-06-16: qty 550
  Filled 2016-06-16 (×2): qty 5

## 2016-06-16 MED ORDER — METOCLOPRAMIDE HCL 5 MG PO TABS: 5.0000 mg | ORAL_TABLET | Freq: Three times a day (TID) | ORAL | Status: DC | PRN

## 2016-06-16 MED ORDER — PHENYLEPHRINE HCL 10 MG/ML IJ SOLN
INTRAMUSCULAR | Status: DC | PRN
  Administered 2016-06-16: 40 ug via INTRAVENOUS
  Administered 2016-06-16 (×2): 80 ug via INTRAVENOUS
  Administered 2016-06-16: 40 ug via INTRAVENOUS
  Administered 2016-06-16 (×2): 80 ug via INTRAVENOUS
  Administered 2016-06-16: 40 ug via INTRAVENOUS
  Administered 2016-06-16: 80 ug via INTRAVENOUS
  Administered 2016-06-16: 40 ug via INTRAVENOUS
  Administered 2016-06-16 (×3): 80 ug via INTRAVENOUS

## 2016-06-16 MED ORDER — CHLORHEXIDINE GLUCONATE 4 % EX LIQD: 60.0000 mL | Freq: Once | CUTANEOUS | Status: DC

## 2016-06-16 MED ORDER — CEFAZOLIN SODIUM-DEXTROSE 2-4 GM/100ML-% IV SOLN
2.0000 g | Freq: Four times a day (QID) | INTRAVENOUS | Status: AC
  Administered 2016-06-16 (×2): 2 g via INTRAVENOUS
  Filled 2016-06-16: qty 100

## 2016-06-16 MED ORDER — MIDAZOLAM HCL 5 MG/5ML IJ SOLN
INTRAMUSCULAR | Status: DC | PRN
  Administered 2016-06-16 (×2): 1 mg via INTRAVENOUS

## 2016-06-16 MED ORDER — DULOXETINE HCL 30 MG PO CPEP
60.0000 mg | ORAL_CAPSULE | Freq: Every day | ORAL | Status: DC
  Administered 2016-06-16: 60 mg via ORAL
  Filled 2016-06-16: qty 2

## 2016-06-16 MED ORDER — MAGNESIUM CITRATE PO SOLN: 1.0000 | Freq: Once | ORAL | Status: DC | PRN

## 2016-06-16 MED ORDER — LACTATED RINGERS IV SOLN
INTRAVENOUS | Status: DC
  Administered 2016-06-16: 11:00:00 via INTRAVENOUS
  Administered 2016-06-16: 1000 mL via INTRAVENOUS
  Administered 2016-06-16 (×2): via INTRAVENOUS

## 2016-06-16 MED ORDER — AMLODIPINE BESYLATE 5 MG PO TABS: 5.0000 mg | ORAL_TABLET | Freq: Every day | ORAL | Status: DC

## 2016-06-16 MED ORDER — METRONIDAZOLE 0.75 % EX GEL
1.0000 "application " | Freq: Two times a day (BID) | CUTANEOUS | Status: DC
  Administered 2016-06-16: 1 via TOPICAL
  Filled 2016-06-16: qty 45

## 2016-06-16 MED ORDER — ROSUVASTATIN CALCIUM 5 MG PO TABS
5.0000 mg | ORAL_TABLET | Freq: Every day | ORAL | Status: DC
  Administered 2016-06-16: 5 mg via ORAL
  Filled 2016-06-16: qty 1

## 2016-06-16 MED ORDER — LEVOTHYROXINE SODIUM 88 MCG PO TABS
88.0000 ug | ORAL_TABLET | Freq: Every day | ORAL | Status: DC
  Administered 2016-06-17: 88 ug via ORAL
  Filled 2016-06-16: qty 1

## 2016-06-16 MED ORDER — BISACODYL 10 MG RE SUPP: 10.0000 mg | Freq: Every day | RECTAL | Status: DC | PRN

## 2016-06-16 MED ORDER — HYDROCODONE-ACETAMINOPHEN 7.5-325 MG PO TABS
1.0000 | ORAL_TABLET | ORAL | Status: DC
  Administered 2016-06-16 – 2016-06-17 (×5): 1 via ORAL
  Filled 2016-06-16 (×2): qty 1
  Filled 2016-06-16: qty 2
  Filled 2016-06-16 (×2): qty 1

## 2016-06-16 MED ORDER — ASPIRIN 81 MG PO CHEW
81.0000 mg | CHEWABLE_TABLET | Freq: Two times a day (BID) | ORAL | Status: DC
  Administered 2016-06-17: 81 mg via ORAL
  Filled 2016-06-16: qty 1

## 2016-06-16 MED ORDER — HYDROMORPHONE HCL 1 MG/ML IJ SOLN
0.2500 mg | INTRAMUSCULAR | Status: DC | PRN
  Administered 2016-06-16 (×2): 0.5 mg via INTRAVENOUS

## 2016-06-16 MED ORDER — ALBUMIN HUMAN 5 % IV SOLN
12.5000 g | Freq: Once | INTRAVENOUS | Status: AC
  Administered 2016-06-16: 12.5 g via INTRAVENOUS

## 2016-06-16 MED ORDER — HYDROCHLOROTHIAZIDE 25 MG PO TABS
25.0000 mg | ORAL_TABLET | Freq: Every day | ORAL | Status: DC
  Administered 2016-06-17: 25 mg via ORAL
  Filled 2016-06-16: qty 1

## 2016-06-16 MED ORDER — PROPOFOL 10 MG/ML IV BOLUS
INTRAVENOUS | Status: AC
  Filled 2016-06-16: qty 60

## 2016-06-16 MED ORDER — HYDROMORPHONE HCL 1 MG/ML IJ SOLN
INTRAMUSCULAR | Status: AC
  Administered 2016-06-16: 0.5 mg via INTRAVENOUS
  Filled 2016-06-16: qty 1

## 2016-06-16 MED ORDER — MIDAZOLAM HCL 2 MG/2ML IJ SOLN
INTRAMUSCULAR | Status: AC
  Filled 2016-06-16: qty 2

## 2016-06-16 MED ORDER — OXYCODONE HCL 5 MG PO TABS
5.0000 mg | ORAL_TABLET | Freq: Once | ORAL | Status: DC | PRN
Start: 2016-06-16 — End: 2016-06-16

## 2016-06-16 MED ORDER — DOCUSATE SODIUM 100 MG PO CAPS
100.0000 mg | ORAL_CAPSULE | Freq: Two times a day (BID) | ORAL | Status: DC
  Administered 2016-06-16 – 2016-06-17 (×2): 100 mg via ORAL
  Filled 2016-06-16 (×2): qty 1

## 2016-06-16 MED ORDER — LIDOCAINE HCL (CARDIAC) 20 MG/ML IV SOLN
INTRAVENOUS | Status: AC
  Filled 2016-06-16: qty 5

## 2016-06-16 MED ORDER — DEXAMETHASONE SODIUM PHOSPHATE 10 MG/ML IJ SOLN
INTRAMUSCULAR | Status: AC
  Filled 2016-06-16: qty 1

## 2016-06-16 MED ORDER — GLYCOPYRROLATE 0.2 MG/ML IJ SOLN
INTRAMUSCULAR | Status: DC | PRN
Start: 2016-06-16 — End: 2016-06-16
  Administered 2016-06-16 (×2): 0.1 mg via INTRAVENOUS

## 2016-06-16 MED ORDER — ALUM & MAG HYDROXIDE-SIMETH 200-200-20 MG/5ML PO SUSP: 30.0000 mL | ORAL | Status: DC | PRN

## 2016-06-16 SURGICAL SUPPLY — 36 items
BAG ZIPLOCK 12X15 (MISCELLANEOUS) IMPLANT
CLOTH BEACON ORANGE TIMEOUT ST (SAFETY) ×3 IMPLANT
COVER PERINEAL POST (MISCELLANEOUS) ×3 IMPLANT
DRAPE STERI IOBAN 125X83 (DRAPES) ×3 IMPLANT
DRAPE U-SHAPE 47X51 STRL (DRAPES) ×6 IMPLANT
DRSG AQUACEL AG ADV 3.5X10 (GAUZE/BANDAGES/DRESSINGS) ×3 IMPLANT
DURAPREP 26ML APPLICATOR (WOUND CARE) ×3 IMPLANT
ELECT REM PT RETURN 15FT ADLT (MISCELLANEOUS) IMPLANT
ELECT REM PT RETURN 9FT ADLT (ELECTROSURGICAL) ×3
ELECTRODE REM PT RTRN 9FT ADLT (ELECTROSURGICAL) ×1 IMPLANT
GLOVE BIOGEL M 7.0 STRL (GLOVE) IMPLANT
GLOVE BIOGEL PI IND STRL 7.5 (GLOVE) ×5 IMPLANT
GLOVE BIOGEL PI IND STRL 8.5 (GLOVE) ×1 IMPLANT
GLOVE BIOGEL PI INDICATOR 7.5 (GLOVE) ×10
GLOVE BIOGEL PI INDICATOR 8.5 (GLOVE) ×2
GLOVE ECLIPSE 8.0 STRL XLNG CF (GLOVE) ×6 IMPLANT
GLOVE ORTHO TXT STRL SZ7.5 (GLOVE) ×6 IMPLANT
GOWN STRL REUS W/TWL LRG LVL3 (GOWN DISPOSABLE) ×6 IMPLANT
GOWN STRL REUS W/TWL XL LVL3 (GOWN DISPOSABLE) ×6 IMPLANT
HEAD FEMORAL 32 CERAMIC (Hips) ×3 IMPLANT
HOLDER FOLEY CATH W/STRAP (MISCELLANEOUS) ×3 IMPLANT
LINER ACET PNNCL PLUS4 NEUTRAL (Hips) ×1 IMPLANT
LIQUID BAND (GAUZE/BANDAGES/DRESSINGS) ×3 IMPLANT
PACK ANTERIOR HIP CUSTOM (KITS) ×3 IMPLANT
PINNACLE PLUS 4 NEUTRAL (Hips) ×3 IMPLANT
SAW OSC TIP CART 19.5X105X1.3 (SAW) ×3 IMPLANT
STEM TRI LOC BPS GRIPTON SZ 2 ×1 IMPLANT
SUT MNCRL AB 4-0 PS2 18 (SUTURE) ×3 IMPLANT
SUT VIC AB 1 CT1 36 (SUTURE) ×9 IMPLANT
SUT VIC AB 2-0 CT1 27 (SUTURE) ×4
SUT VIC AB 2-0 CT1 TAPERPNT 27 (SUTURE) ×2 IMPLANT
SUT VLOC 180 0 24IN GS25 (SUTURE) ×3 IMPLANT
TRAY FOLEY W/METER SILVER 14FR (SET/KITS/TRAYS/PACK) ×3 IMPLANT
TRAY FOLEY W/METER SILVER 16FR (SET/KITS/TRAYS/PACK) IMPLANT
TRI LOC BPS W GRIPTON SZ 2 ×3 IMPLANT
WATER STERILE IRR 1500ML POUR (IV SOLUTION) ×3 IMPLANT

## 2016-06-16 NOTE — Interval H&P Note (Signed)
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 After consideration of risks, benefits and other options for treatment, the patient has consented to  Procedure(s): RIGHT ANTERIOR HIP REVISION WITH FEMORAL COMPONENT (Right) as a surgical intervention .  The patient's history has been reviewed, patient examined, no change in status, stable for surgery.  I have reviewed the patient's chart and labs.  Questions were answered to the patient's satisfaction.     [name]

## 2016-06-16 NOTE — Discharge Instructions (Signed)
INSTRUCTIONS AFTER JOINT REPLACEMENT   o Remove items at home which could result in a fall. This includes throw rugs or furniture in walking pathways o ICE to the affected joint every three hours while awake for 30 minutes at a time, for at least the first 3-5 days, and then as needed for pain and swelling.  Continue to use ice for pain and swelling. You may notice swelling that will progress down to the foot and ankle.  This is normal after surgery.  Elevate your leg when you are not up walking on it.   o Continue to use the breathing machine you got in the hospital (incentive spirometer) which will help keep your temperature down.  It is common for your temperature to cycle up and down following surgery, especially at night when you are not up moving around and exerting yourself.  The breathing machine keeps your lungs expanded and your temperature down.   DIET:  As you were doing prior to hospitalization, we recommend a well-balanced diet.  DRESSING / WOUND CARE / SHOWERING  Keep the surgical dressing until follow up.  The dressing is water proof, so you can shower without any extra covering.  IF THE DRESSING FALLS OFF or the wound gets wet inside, change the dressing with sterile gauze.  Please use good hand washing techniques before changing the dressing.  Do not use any lotions or creams on the incision until instructed by your surgeon.    ACTIVITY  o Increase activity slowly as tolerated, but follow the weight bearing instructions below.   o No driving for 6 weeks or until further direction given by your physician.  You cannot drive while taking narcotics.  o No lifting or carrying greater than 10 lbs. until further directed by your surgeon. o Avoid periods of inactivity such as sitting longer than an hour when not asleep. This helps prevent blood clots.  o You may return to work once you are authorized by your doctor.     WEIGHT BEARING   Partial weight bearing with assist device as  directed.  50% right leg as directed by Orthopaedics (4-6 weeks).   EXERCISES  Results after joint replacement surgery are often greatly improved when you follow the exercise, range of motion and muscle strengthening exercises prescribed by your doctor. Safety measures are also important to protect the joint from further injury. Any time any of these exercises cause you to have increased pain or swelling, decrease what you are doing until you are comfortable again and then slowly increase them. If you have problems or questions, call your caregiver or physical therapist for advice.   Rehabilitation is important following a joint replacement. After just a few days of immobilization, the muscles of the leg can become weakened and shrink (atrophy).  These exercises are designed to build up the tone and strength of the thigh and leg muscles and to improve motion. Often times heat used for twenty to thirty minutes before working out will loosen up your tissues and help with improving the range of motion but do not use heat for the first two weeks following surgery (sometimes heat can increase post-operative swelling).   These exercises can be done on a training (exercise) mat, on the floor, on a table or on a bed. Use whatever works the best and is most comfortable for you.    Use music or television while you are exercising so that the exercises are a pleasant break in your day. This  will make your life better with the exercises acting as a break in your routine that you can look forward to.   Perform all exercises about fifteen times, three times per day or as directed.  You should exercise both the operative leg and the other leg as well.  Exercises include:    Quad Sets - Tighten up the muscle on the front of the thigh (Quad) and hold for 5-10 seconds.    Straight Leg Raises - With your knee straight (if you were given a brace, keep it on), lift the leg to 60 degrees, hold for 3 seconds, and slowly  lower the leg.  Perform this exercise against resistance later as your leg gets stronger.   Leg Slides: Lying on your back, slowly slide your foot toward your buttocks, bending your knee up off the floor (only go as far as is comfortable). Then slowly slide your foot back down until your leg is flat on the floor again.   Angel Wings: Lying on your back spread your legs to the side as far apart as you can without causing discomfort.   Hamstring Strength:  Lying on your back, push your heel against the floor with your leg straight by tightening up the muscles of your buttocks.  Repeat, but this time bend your knee to a comfortable angle, and push your heel against the floor.  You may put a pillow under the heel to make it more comfortable if necessary.   A rehabilitation program following joint replacement surgery can speed recovery and prevent re-injury in the future due to weakened muscles. Contact your doctor or a physical therapist for more information on knee rehabilitation.    CONSTIPATION  Constipation is defined medically as fewer than three stools per week and severe constipation as less than one stool per week.  Even if you have a regular bowel pattern at home, your normal regimen is likely to be disrupted due to multiple reasons following surgery.  Combination of anesthesia, postoperative narcotics, change in appetite and fluid intake all can affect your bowels.   YOU MUST use at least one of the following options; they are listed in order of increasing strength to get the job done.  They are all available over the counter, and you may need to use some, POSSIBLY even all of these options:    Drink plenty of fluids (prune juice may be helpful) and high fiber foods Colace 100 mg by mouth twice a day  Senokot for constipation as directed and as needed Dulcolax (bisacodyl), take with full glass of water  Miralax (polyethylene glycol) once or twice a day as needed.  If you have tried all  these things and are unable to have a bowel movement in the first 3-4 days after surgery call either your surgeon or your primary doctor.    If you experience loose stools or diarrhea, hold the medications until you stool forms back up.  If your symptoms do not get better within 1 week or if they get worse, check with your doctor.  If you experience "the worst abdominal pain ever" or develop nausea or vomiting, please contact the office immediately for further recommendations for treatment.   ITCHING:  If you experience itching with your medications, try taking only a single pain pill, or even half a pain pill at a time.  You can also use Benadryl over the counter for itching or also to help with sleep.   TED HOSE STOCKINGS:  Use  stockings on both legs until for at least 2 weeks or as directed by physician office. They may be removed at night for sleeping.  MEDICATIONS:  See your medication summary on the After Visit Summary that nursing will review with you.  You may have some home medications which will be placed on hold until you complete the course of blood thinner medication.  It is important for you to complete the blood thinner medication as prescribed.  PRECAUTIONS:  If you experience chest pain or shortness of breath - call 911 immediately for transfer to the hospital emergency department.   If you develop a fever greater that 101 F, purulent drainage from wound, increased redness or drainage from wound, foul odor from the wound/dressing, or calf pain - CONTACT YOUR SURGEON.                                                   FOLLOW-UP APPOINTMENTS:  If you do not already have a post-op appointment, please call the office for an appointment to be seen by your surgeon.  Guidelines for how soon to be seen are listed in your After Visit Summary, but are typically between 1-4 weeks after surgery.  OTHER INSTRUCTIONS:   Knee Replacement:  Do not place pillow under knee, focus on keeping the  knee straight while resting.   MAKE SURE YOU:   Understand these instructions.   Get help right away if you are not doing well or get worse.    Thank you for letting us be a part of your medical care team.  It is a privilege we respect greatly.  We hope these instructions will help you stay on track for a fast and full recovery!

## 2016-06-16 NOTE — Anesthesia Preprocedure Evaluation (Signed)
Anesthesia Evaluation  Patient identified by MRN, date of birth, ID band Patient awake    Reviewed: Allergy & Precautions, H&P , NPO status , Patient's Chart, lab work & pertinent test results  Airway Mallampati: II   Neck ROM: full    Dental   Pulmonary former smoker,    breath sounds clear to auscultation       Cardiovascular hypertension,  Rhythm:regular Rate:Normal     Neuro/Psych PSYCHIATRIC DISORDERS Anxiety Depression    GI/Hepatic   Endo/Other  Hypothyroidism   Renal/GU      Musculoskeletal  (+) Arthritis ,   Abdominal   Peds  Hematology   Anesthesia Other Findings   Reproductive/Obstetrics                             Anesthesia Physical Anesthesia Plan  ASA: II  Anesthesia Plan: MAC and Spinal   Post-op Pain Management:    Induction: Intravenous  Airway Management Planned: Simple Face Mask  Additional Equipment:   Intra-op Plan:   Post-operative Plan:   Informed Consent: I have reviewed the patients History and Physical, chart, labs and discussed the procedure including the risks, benefits and alternatives for the proposed anesthesia with the patient or authorized representative who has indicated his/her understanding and acceptance.     Plan Discussed with: CRNA, Anesthesiologist and Surgeon  Anesthesia Plan Comments:         Anesthesia Quick Evaluation

## 2016-06-16 NOTE — Op Note (Signed)
NAMEMarland Kitchen  Kelly Everett, Kelly Everett NO.:  [id number]  MEDICAL RECORD NO.:  [id number]  LOCATION:  1609                         FACILITY:  [facility]  PHYSICIAN:  Madlyn Frankel. Charlann Boxer, M.D.  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  PROCEDURE:  Revision right hip replacement revising the femoral component from a size 0 to a size 2 high Tri-Lock stem with a 32+ 1 delta ceramic ball.  I did revise the acetabular polyethylene liner exchanging for the same polyethylene, which was a size 32+ 4 neutral AltrX liner to fit in 50-mm Pinnacle shell.  SURGEON:  Madlyn Frankel. Charlann Boxer, M.D.  ASSISTANT:  [name], PA-C.  Note that Kelly Everett was present for the entirety of the case from preoperative position, perioperative management of the operative extremity, general facilitation of the case and primary wound closure.  ANESTHESIA:  Spinal.  SPECIMENS:  None.  COMPLICATION:  None.  BLOOD LOSS:  900 mL.  INDICATIONS FOR PROCEDURE:  Kelly Everett is a pleasant 70 year old female, greater than 3 months out from a right total hip arthroplasty.  She had been doing initially very well within the first 3-4 weeks of the operation.  She had a jarring-type step that she could recall after further discussion.  Since that time, she began having some pain.  She was seen and evaluated at 6 weeks and noted to have some femoral subsidence.  We chose to observe to see if there would be any chance that it would have some bony ingrowth, or further subsidence and be relatively tolerable to her.  However, at her 46-month followup, she was noted to have persistent subsidence, concerns with pain with ambulation and obvious  discrepancy in her lower extremity and gait.  She did not have evidence of any periprosthetic fracture radiographically.  At this point, as we had previously alluded to, we discussed revision surgery with revision of the femoral stem.  We discussed the risks of it occurring again, the risk of infection, DVT, dislocation in the revision setting and consent was obtained for benefit of improved pain and function for revision surgery.  PROCEDURE IN DETAIL:  The patient was brought to the operative theater. Once adequate anesthesia, preoperative antibiotics, 2 g of Ancef, 1 g of tranexamic acid, and 10 mg of Decadron administered, she was positioned supine on the [facility] operating table.  Once she was appropriately positioned with bony prominences padded, we then pre-draped out the right hip area.  The right hip was then prepped and draped in sterile fashion.  The time-out was performed identifying the patient, planned procedure and extremity.  Her old incision was excised.  Soft tissue dissection was carried down to the tensor fascia.  This layer was then incised.  It was noted to be adherent to the muscle belly underneath.  I did use a Cobb elevator as well as a sharp knife to try to dissect this layer off so I could find the interval typically utilized  for primary procedures in the anterior hip.  I did use fluoroscopy couple of different times to assure that we were in appropriate orientation and not too far medial or lateral.  Once I had confirmed the appropriate interval, I did place retractors superior to the neck as well as inferior.  At this point, we excised anterior capsular tissues to expose the joint. Once I had probably one-third to half of the anterior aspect of the hip exposed, I was able to then apply traction distally on the table.  We then used an impactor and removed the femoral head from the trunnion.  Then with further traction, the femoral head was able to be  removed.  All while, I am working on exposure, removing scar and capsular tissues in this area to allow for rotation of the hip itself.  During all of this, I made a decision based on the use of cautery around the polyethylene liner that I would revise the liner as well to prevent any potential accelerated wear by the use of the thermal device surround it.  With the hip at this point exposed, I was able then to rotate the proximal femur to 100 degrees.  We used the lateral hook to stabilize the femur once elevated.  With the femur rolled to 100 degrees, Mueller retractor was placed along the inferior neck and a lateral retractor on the trochanter.  Again, further exposure was carried out removing old scar and capsule tissue.  The proximal femur was exposed.  I initially attempted to use an S-ROM loop extractor.  With this, I was actually able to back the stem out without the use of any thin osteotomes indicating that there was no significant ingrowth.  At this point, I used a curette to remove any fibrous tissue from the proximal femur as far down as I could reach.  I then began broaching initially with a 0 broach.  It was evident to me as not only that there was a subsidence, but she may have had a torsional motion with that initial issue as the stem had to rotated into a retroverted position.  I broached up to a size 2 broach maintaining appropriate anteverted position.  This locked in well on the medial and lateral metaphyseal region.  I did a trial reduction.  Once I did this broaching placed, I removed the final broach and replaced the leg into its neutral position and replaced retractors for the acetabulum.  Following further debridement of scar tissue circumferentially, I was able to remove the acetabular liner using the extraction device from [employer] and following irrigation of this area, we re-selected a 32+ 4 neutral AltrX liner and impacted it into the  acetabular shell with good visualized rim fit.  Once this was completed, I repositioned the femur and placed the size 2 broach.  With the 2 broach in place, we trialed with a high-offset neck and 32 +1 trial ball.  With this, under fluoroscopic imaging, we found that we had restored leg lengths.  Based on the position of previous neck cut, had returned her hip back to its neutral position.  At this point, the hip was dislocated.  The femoral broach remained stable during this time.  We then selected the size 2 high Tri-Lock stem, it was opened on the back table following irrigation of the femoral canal.  The final 2 stem was impacted and sat at the level where the broach was.  Based on this and trial reductions, a 32+  1 ball was then impacted on clean and dried trunnion and the hip reduced.  We had irrigated the hip throughout the case and again, at this point.  I did note some posterior oozing from the soft tissues and scar debridement for the capsular debridement around the posterior aspect of the acetabulum.  I did place some thrombin-soaked Gelfoam into the back of the acetabulum and try to help with constriction of these vessels.  The hip was then closed predominantly with the tensor fascia lata layer as there was no capsular tissue anteriorly for reapproximation.  The remainder of the wound was closed with 2-0 Vicryl and running 3-0 Monocryl.  The hip was then cleaned, dried and dressed sterilely using surgical glue and an Aquacel dressing.  She was then brought to the recovery room in stable condition tolerating the procedure well. Findings were reviewed with her husband.  We anticipate her being in partial weightbearing from 4-6 weeks depending on her pain level, radiographic findings as well as tolerance of this restriction.     Madlyn Frankel Charlann Boxer, M.D.     MDO/MEDQ  D:  06/16/2016  T:  06/16/2016  Job:  [id number]

## 2016-06-16 NOTE — Transfer of Care (Signed)
Immediate Anesthesia Transfer of Care Note  Patient: Kelly Everett  Procedure(s) Performed: Procedure(s): RIGHT ANTERIOR HIP REVISION WITH FEMORAL COMPONENT (Right)  Patient Location: PACU  Anesthesia Type:Spinal  Level of Consciousness: awake, alert , oriented and patient cooperative  Airway & Oxygen Therapy: Patient Spontanous Breathing and Patient connected to face mask oxygen  Post-op Assessment: Report given to RN and Post -op Vital signs reviewed and stable  Post vital signs: Reviewed and stable  Last Vitals:  Vitals:   06/16/16 0743  BP: (!) 155/87  Pulse: 80  Resp: 18  Temp: 36.6 C    Last Pain:  Vitals:   06/16/16 0834  TempSrc:   PainSc: 3       Patients Stated Pain Goal: 4 (06/16/16 0834)  Complications: No apparent anesthesia complications

## 2016-06-16 NOTE — Anesthesia Postprocedure Evaluation (Signed)
Anesthesia Post Note  Patient: Kelly Everett  Procedure(s) Performed: Procedure(s) (LRB): RIGHT ANTERIOR HIP REVISION WITH FEMORAL COMPONENT (Right)  Patient location during evaluation: PACU Anesthesia Type: Spinal Level of consciousness: oriented and awake and alert Pain management: pain level controlled Vital Signs Assessment: post-procedure vital signs reviewed and stable Respiratory status: spontaneous breathing, respiratory function stable and patient connected to nasal cannula oxygen Cardiovascular status: blood pressure returned to baseline and stable Postop Assessment: no headache and no backache Anesthetic complications: no    Last Vitals:  Vitals:   06/16/16 0743 06/16/16 1207  BP: (!) 155/87   Pulse: 80   Resp: 18   Temp: 36.6 C 36.7 C    Last Pain:  Vitals:   06/16/16 1207  TempSrc:   PainSc: 5                  Raymundo Rout S

## 2016-06-16 NOTE — Interval H&P Note (Signed)
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 After consideration of risks, benefits and other options for treatment, the patient has consented to  Procedure(s): RIGHT ANTERIOR HIP REVISION WITH FEMORAL COMPONENT (Right) as a surgical intervention .  The patient's history has been reviewed, patient examined, no change in status, stable for surgery.  I have reviewed the patient's chart and labs.  Questions were answered to the patient's satisfaction.     [name]

## 2016-06-16 NOTE — Brief Op Note (Signed)
06/16/2016  11:58 AM  PATIENT:  Kelly Everett  70 y.o. 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  PROCEDURE:  Procedure(s): RIGHT ANTERIOR HIP REVISION WITH FEMORAL COMPONENT (Right)  SURGEON:  Surgeon(s) and Role:    * Durene Romans, MD - Primary  PHYSICIAN ASSISTANT: [name], PA-C  ANESTHESIA:   spinal  EBL:  Total I/O In: 2000 [I.V.:2000] Out: 1350 [Urine:450; Blood:900]  BLOOD ADMINISTERED:none  DRAINS: none   LOCAL MEDICATIONS USED:  NONE  SPECIMEN:  No Specimen  DISPOSITION OF SPECIMEN:  N/A  COUNTS:  YES  TOURNIQUET:  * No tourniquets in log *  DICTATION: .Other Dictation: Dictation Number [id number]  PLAN OF CARE: Admit to inpatient   PATIENT DISPOSITION:  PACU - hemodynamically stable.   Delay start of Pharmacological VTE agent (>24hrs) due to surgical blood loss or risk of bleeding: no

## 2016-06-16 NOTE — Anesthesia Procedure Notes (Signed)
Spinal  Patient location during procedure: OR Start time: 06/16/2016 9:43 AM End time: 06/16/2016 9:48 AM Staffing Anesthesiologist: Chaney Malling, Merwyn Hodapp Performed: anesthesiologist  Preanesthetic Checklist Completed: patient identified, site marked, surgical consent, pre-op evaluation, timeout performed, IV checked, risks and benefits discussed and monitors and equipment checked Spinal Block Patient position: sitting Prep: Betadine and site prepped and draped Patient monitoring: heart rate, cardiac monitor, continuous pulse ox and blood pressure Approach: midline Location: L3-4 Injection technique: single-shot Needle Needle type: Pencan  Needle gauge: 24 G Needle length: 10 cm Assessment Sensory level: T6 Additional Notes Pt tolerated the procedure well.

## 2016-06-17 LAB — CBC
HEMATOCRIT: 28.3 % — AB (ref 36.0–46.0)
Hemoglobin: 9.4 g/dL — ABNORMAL LOW (ref 12.0–15.0)
MCH: 29.8 pg (ref 26.0–34.0)
MCHC: 33.2 g/dL (ref 30.0–36.0)
MCV: 89.8 fL (ref 78.0–100.0)
Platelets: 154 10*3/uL (ref 150–400)
RBC: 3.15 MIL/uL — AB (ref 3.87–5.11)
RDW: 13.5 % (ref 11.5–15.5)
WBC: 9.6 10*3/uL (ref 4.0–10.5)

## 2016-06-17 LAB — BASIC METABOLIC PANEL
ANION GAP: 4 — AB (ref 5–15)
BUN: 10 mg/dL (ref 6–20)
CO2: 26 mmol/L (ref 22–32)
Calcium: 7.9 mg/dL — ABNORMAL LOW (ref 8.9–10.3)
Chloride: 110 mmol/L (ref 101–111)
Creatinine, Ser: 0.38 mg/dL — ABNORMAL LOW (ref 0.44–1.00)
GFR calc Af Amer: >60 mL/min (ref >60)
GLUCOSE: 213 mg/dL — AB (ref 65–99)
POTASSIUM: 3.3 mmol/L — AB (ref 3.5–5.1)
Sodium: 140 mmol/L (ref 135–145)

## 2016-06-17 MED ORDER — DOCUSATE SODIUM 100 MG PO CAPS: 100.0000 mg | ORAL_CAPSULE | Freq: Two times a day (BID) | ORAL | 0 refills | Status: AC

## 2016-06-17 MED ORDER — ASPIRIN 81 MG PO CHEW: 81.0000 mg | CHEWABLE_TABLET | Freq: Two times a day (BID) | ORAL | 0 refills | Status: AC

## 2016-06-17 MED ORDER — POLYETHYLENE GLYCOL 3350 17 G PO PACK: 17.0000 g | PACK | Freq: Two times a day (BID) | ORAL | 0 refills | Status: AC

## 2016-06-17 MED ORDER — HYDROCODONE-ACETAMINOPHEN 7.5-325 MG PO TABS: 1.0000 | ORAL_TABLET | ORAL | 0 refills | Status: AC | PRN

## 2016-06-17 MED ORDER — FERROUS SULFATE 325 (65 FE) MG PO TABS: 325.0000 mg | ORAL_TABLET | Freq: Three times a day (TID) | ORAL | 3 refills | Status: AC

## 2016-06-17 MED ORDER — TIZANIDINE HCL 4 MG PO TABS: 4.0000 mg | ORAL_TABLET | Freq: Four times a day (QID) | ORAL | 0 refills | Status: AC | PRN

## 2016-06-17 NOTE — Progress Notes (Signed)
   06/17/16 1200  PT Visit Information  Last PT Received On 06/17/16  Pt doing very well; all questions/concerns addressed  Assistance Needed +1  History of Present Illness s/p R DA THA revision  Subjective Data  Subjective am I going home today  Patient Stated Goal home, return to IND  Restrictions  RLE Weight Bearing PWB  RLE Partial Weight Bearing Percentage or Pounds 50%  Pain Assessment  Pain Assessment 0-10  Pain Score 4  Pain Location right hip  Pain Descriptors / Indicators Burning  Pain Intervention(s) Limited activity within patient's tolerance;Monitored during session;Premedicated before session;Ice applied  Cognition  Arousal/Alertness Awake/alert  Behavior During Therapy WFL for tasks assessed/performed  Overall Cognitive Status Within Functional Limits for tasks assessed  Bed Mobility  Overal bed mobility Needs Assistance  Bed Mobility Sit to Supine  Sit to supine Min guard  General bed mobility comments cues for technique  Transfers  Overall transfer level Needs assistance  Equipment used Rolling walker (2 wheeled)  Transfers Sit to/from Stand  Sit to Stand Supervision  General transfer comment cues for hand palcement  Ambulation/Gait  Ambulation/Gait assistance Supervision  Ambulation Distance (Feet) 5 Feet  Assistive device Rolling walker (2 wheeled)  General Gait Details for safety  Stairs (reviewed verbally, handout reviewed; pt/husb comfortable)  Exercises  Exercises Total Joint  Total Joint Exercises  Ankle Circles/Pumps AROM;Both;10 reps  [employer] AROM;Strengthening;10 reps  Short Arc Quad AROM;Strengthening;Right;10 reps  Heel Slides AROM;Both;10 reps  Hip ABduction/ADduction AAROM;AROM;10 reps;Right;Supine;Limitations  Hip Abduction/Adduction Limitations incr burning right groin, advised pt to defer ex if incr pain  PT - End of Session  Equipment Utilized During Treatment Gait belt  Activity Tolerance Patient tolerated treatment well   Patient left in bed;with call bell/phone within reach;with bed alarm set;with family/visitor present  Nurse Communication Mobility status  PT - Assessment/Plan  PT Plan Current plan remains appropriate  PT Frequency (ACUTE ONLY) 7X/week  Follow Up Recommendations No PT follow up  PT equipment None recommended by PT  PT Goal Progression  Progress towards PT goals Progressing toward goals  Acute Rehab PT Goals  PT Goal Formulation With patient  Time For Goal Achievement 06/24/16  Potential to Achieve Goals Good  PT Time Calculation  PT Start Time (ACUTE ONLY) 1130  PT Stop Time (ACUTE ONLY) 1154  PT Time Calculation (min) (ACUTE ONLY) 24 min  PT General Charges  $$ ACUTE PT VISIT 1 Procedure  PT Treatments  $Therapeutic Exercise 23-37 mins

## 2016-06-17 NOTE — Discharge Summary (Signed)
Physician Discharge Summary  Patient ID: Kelly Everett MRN: [id number] DOB/AGE: June 11, 1946 70 y.o.  Admit date: 06/16/2016 Discharge date: 06/17/2016   Procedures:  Procedure(s) (LRB): RIGHT ANTERIOR HIP REVISION WITH FEMORAL COMPONENT (Right)  Attending Physician:  Dr. Durene Romans   Admission Diagnoses:   Right hip pain s/p THA  Discharge Diagnoses:  Principal Problem:   S/P revision right THA  Past Medical History:  Diagnosis Date  . Anxiety   . Arthritis    OA  . Depression   . Hypertension   . Hypothyroidism   . Tinnitus   . Wears glasses     HPI:    Kelly Everett, 70 y.o. 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 The indications for the revision total hip arthroplasty are subsidence of the femoral component. Onset of symptoms was abrupt starting 3-4 months ago with rapidlly worsening course since that time.  Prior procedures on the right hip include arthroplasty. Patient currently rates pain in the right hip at 9 out of 10 with activity.  There is night pain, worsening of pain with activity and weight bearing, trendelenberg gait, pain that interfers with activities of daily living and pain with passive range of motion. Patient has evidence of subsidence of femoral component by imaging studies.  This condition presents safety issues increasing the risk of falls.  There is no current active infection.   Risks, benefits and expectations were discussed with the patient.  Risks including but not limited to the risk of anesthesia, blood clots, nerve damage, blood vessel damage, failure of the prosthesis, infection and up to and including death.  Patient understand the risks, benefits and  expectations and wishes to proceed with surgery.    PCP: Valla Leaver, MD   Discharged Condition: good  Hospital Course:  Patient underwent the above stated procedure on 06/16/2016. Patient tolerated the procedure well and brought to the recovery room in good condition and subsequently to the floor.  POD #1 BP: 135/65 ; Pulse: 75 ; Temp: 98.1 F (36.7 C) ; Resp: 18 Patient reports pain as mild while on pain meds and compared to her last operation.  No events.  Actually expressing desire to go home today.  At time of visit she had already been up with PT.  Some burning pain in groin with abduction movement. Neurovascular intact and incision: dressing C/D/I  LABS  Basename    HGB     9.4  HCT     28.3    Discharge Exam: General appearance: alert, cooperative and no distress Extremities: Homans sign is negative, no sign of DVT, no edema, redness or tenderness in the calves or thighs and no ulcers, gangrene or trophic changes  Disposition: Home with follow up in 2 weeks   Follow-up Information    [name], MD. Schedule an appointment as soon as possible for a visit in 2 week(s).   Specialty:  Orthopedic Surgery Contact information: [address] [place] [address] [phone]           Discharge Instructions    Call MD / Call 911    Complete by:  As directed   If you experience chest pain or shortness of breath, CALL 911 and be transported to  the hospital emergency room.  If you develope a fever above 101 F, pus (white drainage) or increased drainage or redness at the wound, or calf pain, call your surgeon's office.   Change dressing    Complete by:  As directed   Maintain surgical dressing until follow up in the clinic. If the edges start to pull up, may reinforce with tape. If the dressing is no longer working, may remove and cover with gauze and tape, but must keep the area dry and clean.  Call with any questions or concerns.    Constipation Prevention    Complete by:  As directed   Drink plenty of fluids.  Prune juice may be helpful.  You may use a stool softener, such as Colace (over the counter) 100 mg twice a day.  Use MiraLax (over the counter) for constipation as needed.   Diet - low sodium heart healthy    Complete by:  As directed   Discharge instructions    Complete by:  As directed   Maintain surgical dressing until follow up in the clinic. If the edges start to pull up, may reinforce with tape. If the dressing is no longer working, may remove and cover with gauze and tape, but must keep the area dry and clean.  Follow up in 2 weeks at [facility]. Call with any questions or concerns.   Partial weight bearing    Complete by:  As directed   Plan on PWB for 4-6 weeks.   % Body Weight:  50   Laterality:  right   Extremity:  Lower   TED hose    Complete by:  As directed   Use stockings (TED hose) for 2 weeks on both leg(s).  You may remove them at night for sleeping.        Medication List    TAKE these medications   amLODipine 5 MG tablet Commonly known as:  NORVASC Take 5 mg by mouth at bedtime.   aspirin 81 MG chewable tablet Chew 1 tablet (81 mg total) by mouth 2 (two) times daily. Take for 4 weeks.   docusate sodium 100 MG capsule Commonly known as:  COLACE Take 1 capsule (100 mg total) by mouth 2 (two) times daily.   DULoxetine 60 MG capsule Commonly known as:  CYMBALTA Take 60 mg by mouth at bedtime.   ESTER-C 500-200-60 MG Tabs Take 1 tablet by mouth 2 (two) times daily.   ferrous sulfate 325 (65 FE) MG tablet Take 1 tablet (325 mg total) by mouth 3 (three) times daily after meals.   HYDROcodone-acetaminophen 7.5-325 MG tablet Commonly known as:  NORCO Take 1-2 tablets by mouth every 4 (four) hours as needed for moderate pain.   levothyroxine 88 MCG tablet Commonly known as:  SYNTHROID, LEVOTHROID Take 88 mcg by mouth daily before breakfast.     losartan-hydrochlorothiazide 100-25 MG tablet Commonly known as:  HYZAAR Take 1 tablet by mouth every morning.   metroNIDAZOLE 0.75 % cream Commonly known as:  METROCREAM Apply 1 application topically 2 (two) times daily.   polyethylene glycol packet Commonly known as:  MIRALAX / GLYCOLAX Take 17 g by mouth 2 (two) times daily.   rosuvastatin 10 MG tablet Commonly known as:  CRESTOR Take 5 mg by mouth at bedtime.   tiZANidine 4 MG tablet Commonly known as:  ZANAFLEX Take 1 tablet (4 mg total) by mouth every 6 (six) hours as needed for muscle spasms.  Signed: Anastasio Auerbach. Ossie Yebra   PA-C  06/17/2016, 6:11 PM

## 2016-06-17 NOTE — Evaluation (Signed)
Physical Therapy Evaluation Patient Details Name: Kelly Everett MRN: [id number] DOB: December 21, 1945 Today's Date: 06/17/2016   History of Present Illness  s/p R DA THA revision  Clinical Impression  Pt is s/p THA resulting in the deficits listed below (see PT Problem List).  Pt will benefit from skilled PT to increase their independence and safety with mobility to allow discharge to the venue listed below.      Follow Up Recommendations No PT follow up    Equipment Recommendations  None recommended by PT    Recommendations for Other Services       Precautions / Restrictions Restrictions Weight Bearing Restrictions: Yes RLE Weight Bearing: Partial weight bearing RLE Partial Weight Bearing Percentage or Pounds: 50%      Mobility  Bed Mobility               General bed mobility comments: pt in chair  Transfers Overall transfer level: Needs assistance Equipment used: Rolling walker (2 wheeled) Transfers: Sit to/from Stand Sit to Stand: Min guard         General transfer comment: cues for hand palcement  Ambulation/Gait Ambulation/Gait assistance: Min guard Ambulation Distance (Feet): 120 Feet   Gait Pattern/deviations: Step-to pattern     General Gait Details: cues for sequence, RW position and safety  Stairs            Wheelchair Mobility    Modified Rankin (Stroke Patients Only)       Balance                                             Pertinent Vitals/Pain Pain Assessment: 0-10 Pain Score: 3  Pain Location: right hip Pain Descriptors / Indicators: Aching;Sore Pain Intervention(s): Limited activity within patient's tolerance;Monitored during session;Premedicated before session    Home Living Family/patient expects to be discharged to:: Private residence Living Arrangements: Spouse/significant other Available Help at Discharge: Family Type of Home: House Home Access: Stairs to enter Entrance Stairs-Rails:  Doctor, general practice of Steps: 5 Home Layout: One level Home Equipment: Bedside commode;Walker - 2 wheels      Prior Function Level of Independence: Independent               Hand Dominance        Extremity/Trunk Assessment   Upper Extremity Assessment: Defer to OT evaluation           Lower Extremity Assessment: RLE deficits/detail RLE Deficits / Details: ankle WFl, knee and hip grossly 2+/5       Communication   Communication: No difficulties  Cognition Arousal/Alertness: Awake/alert Behavior During Therapy: WFL for tasks assessed/performed Overall Cognitive Status: Within Functional Limits for tasks assessed                      General Comments      Exercises Total Joint Exercises Ankle Circles/Pumps: AROM;Both;10 reps      Assessment/Plan    PT Assessment Patient needs continued PT services  PT Diagnosis Difficulty walking   PT Problem List Decreased activity tolerance;Decreased knowledge of use of DME;Decreased mobility  PT Treatment Interventions DME instruction;Gait training;Functional mobility training;Therapeutic activities;Therapeutic exercise   PT Goals (Current goals can be found in the Care Plan section) Acute Rehab PT Goals Patient Stated Goal: home, return to IND PT Goal Formulation: With patient Time For Goal Achievement: 06/24/16 Potential to  Achieve Goals: Good    Frequency 7X/week   Barriers to discharge        Co-evaluation               End of Session Equipment Utilized During Treatment: Gait belt Activity Tolerance: Patient tolerated treatment well Patient left: with call bell/phone within reach;in chair;with chair alarm set Nurse Communication: Mobility status         Time: 2440-1027 PT Time Calculation (min) (ACUTE ONLY): 18 min   Charges:   PT Evaluation $PT Eval Low Complexity: 1 Procedure     PT G Codes:        Llerenas,Jr Mill[place] 07-02-16, 10:53 AM

## 2016-06-17 NOTE — Progress Notes (Signed)
OT Cancellation Note  Patient Details Name: Kelly Everett MRN: [id number] DOB: [birth year]   Cancelled Treatment:    Reason Eval/Treat Not Completed: Other (comment).  Pt recently had THA and feels comfortable with everything but was having difficulty with hygiene.  Educated on toilet aides and provided handout of models.  No further needs  Darus Hershman 06/17/2016, 10:08 AM  Marica Otter, OTR/L [phone] 06/17/2016

## 2016-06-17 NOTE — Care Management Note (Signed)
Case Management Note  Patient Details  Name: Kelly Everett MRN: [id number] Date of Birth: Feb 09, 1946  Subjective/Objective:                   RIGHT ANTERIOR HIP REVISION WITH FEMORAL COMPONENT (Right) Action/Plan: Discharge planning Expected Discharge Date:                  Expected Discharge Plan:  Home/Self Care  In-House Referral:  NA  Discharge planning Services  CM Consult  Post Acute Care Choice:  NA Choice offered to:     DME Arranged:  N/A DME Agency:  NA  HH Arranged:  NA HH Agency:  NA  Status of Service:  Completed, signed off  If discussed at [place] of Stay Meetings, dates discussed:    Additional Comments: CM met with pt to confirm no HH services; pt confirms.  Pt states she has all DME at home.  NO other CM needs were communicated. Dellie Catholic, RN 06/17/2016, 11:50 AM

## 2016-06-17 NOTE — Progress Notes (Signed)
Patient ID: Kelly Everett, female   DOB: April 27, 1946, 70 y.o.   MRN: [id number] Subjective: 1 Day Post-Op Procedure(s) (LRB): RIGHT ANTERIOR HIP REVISION WITH FEMORAL COMPONENT (Right)    Patient reports pain as mild while on pain meds and compared to her last operation No events.  Actually expressing desire to go home today.  At time of visit she had already been up with PT Some burning pain in groin with abduction movement Objective:   VITALS:   Vitals:   06/17/16 0952 06/17/16 1429  BP: (!) 145/65 135/65  Pulse: 78 75  Resp: 18 18  Temp: 98.6 F (37 C) 98.1 F (36.7 C)    Neurovascular intact Incision: dressing C/D/I  LABS  Recent Labs  06/16/16 1237 06/17/16 0430  HGB 9.4* 9.4*  HCT 29.3* 28.3*  WBC  --  9.6  PLT  --  154     Recent Labs  06/17/16 0430  NA 140  K 3.3*  BUN 10  CREATININE 0.38*  GLUCOSE 213*    No results for input(s): LABPT, INR in the last 72 hours.   Assessment/Plan: 1 Day Post-Op Procedure(s) (LRB): RIGHT ANTERIOR HIP REVISION WITH FEMORAL COMPONENT (Right)   Up with therapy Discharge home this pm after afternoon therapy session  Rx on chart

## 2016-09-11 IMAGING — DX DG HIP (WITH OR WITHOUT PELVIS) 1V PORT*R*
3 series · 3 of 3 positions shown · non-contrast
Comparison: 03/04/2016

CLINICAL DATA: Status post revision of right total hip
arthroplasty.

EXAM:
DG HIP (WITH OR WITHOUT PELVIS) 1V PORT RIGHT

[hip frog leg (1 of 2)]
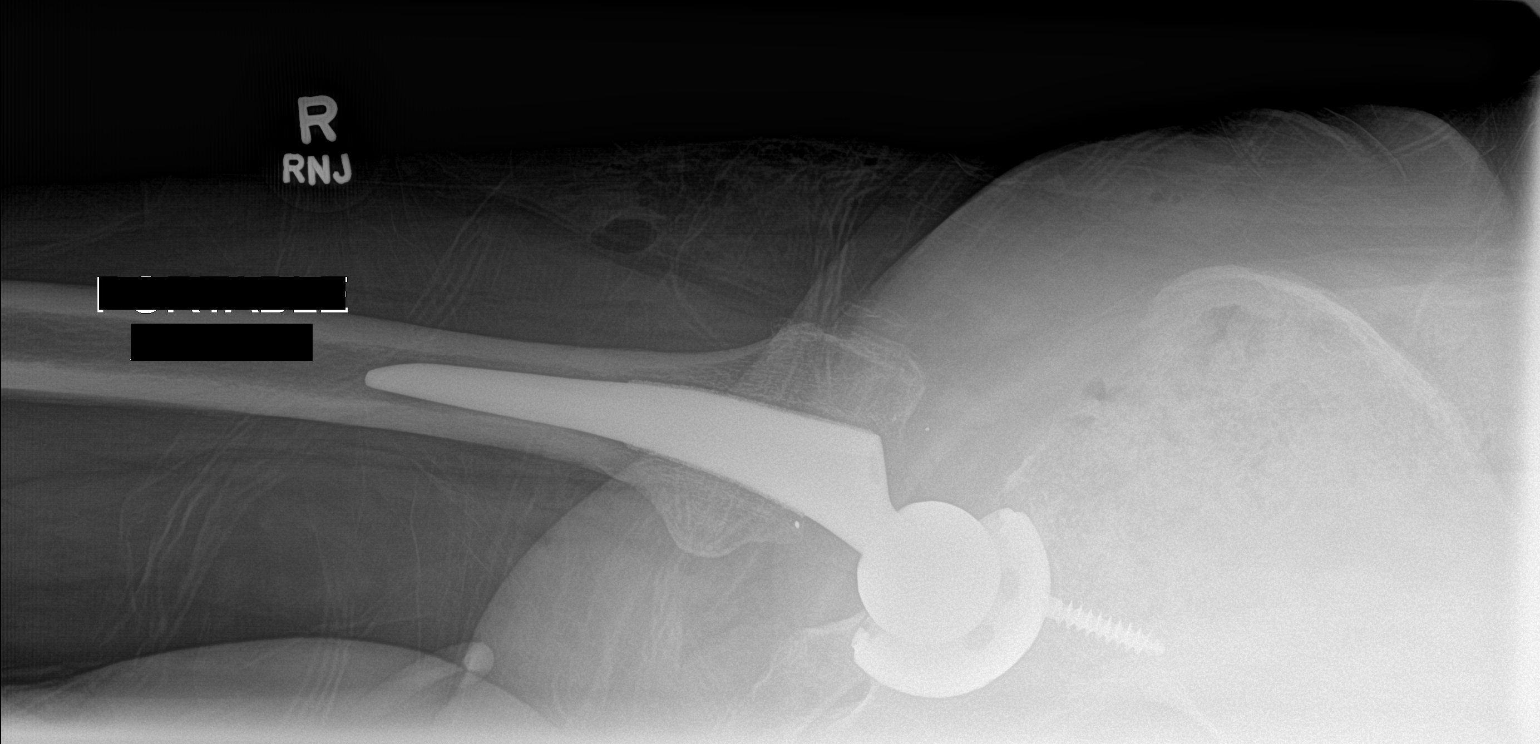

[pelvis ap]
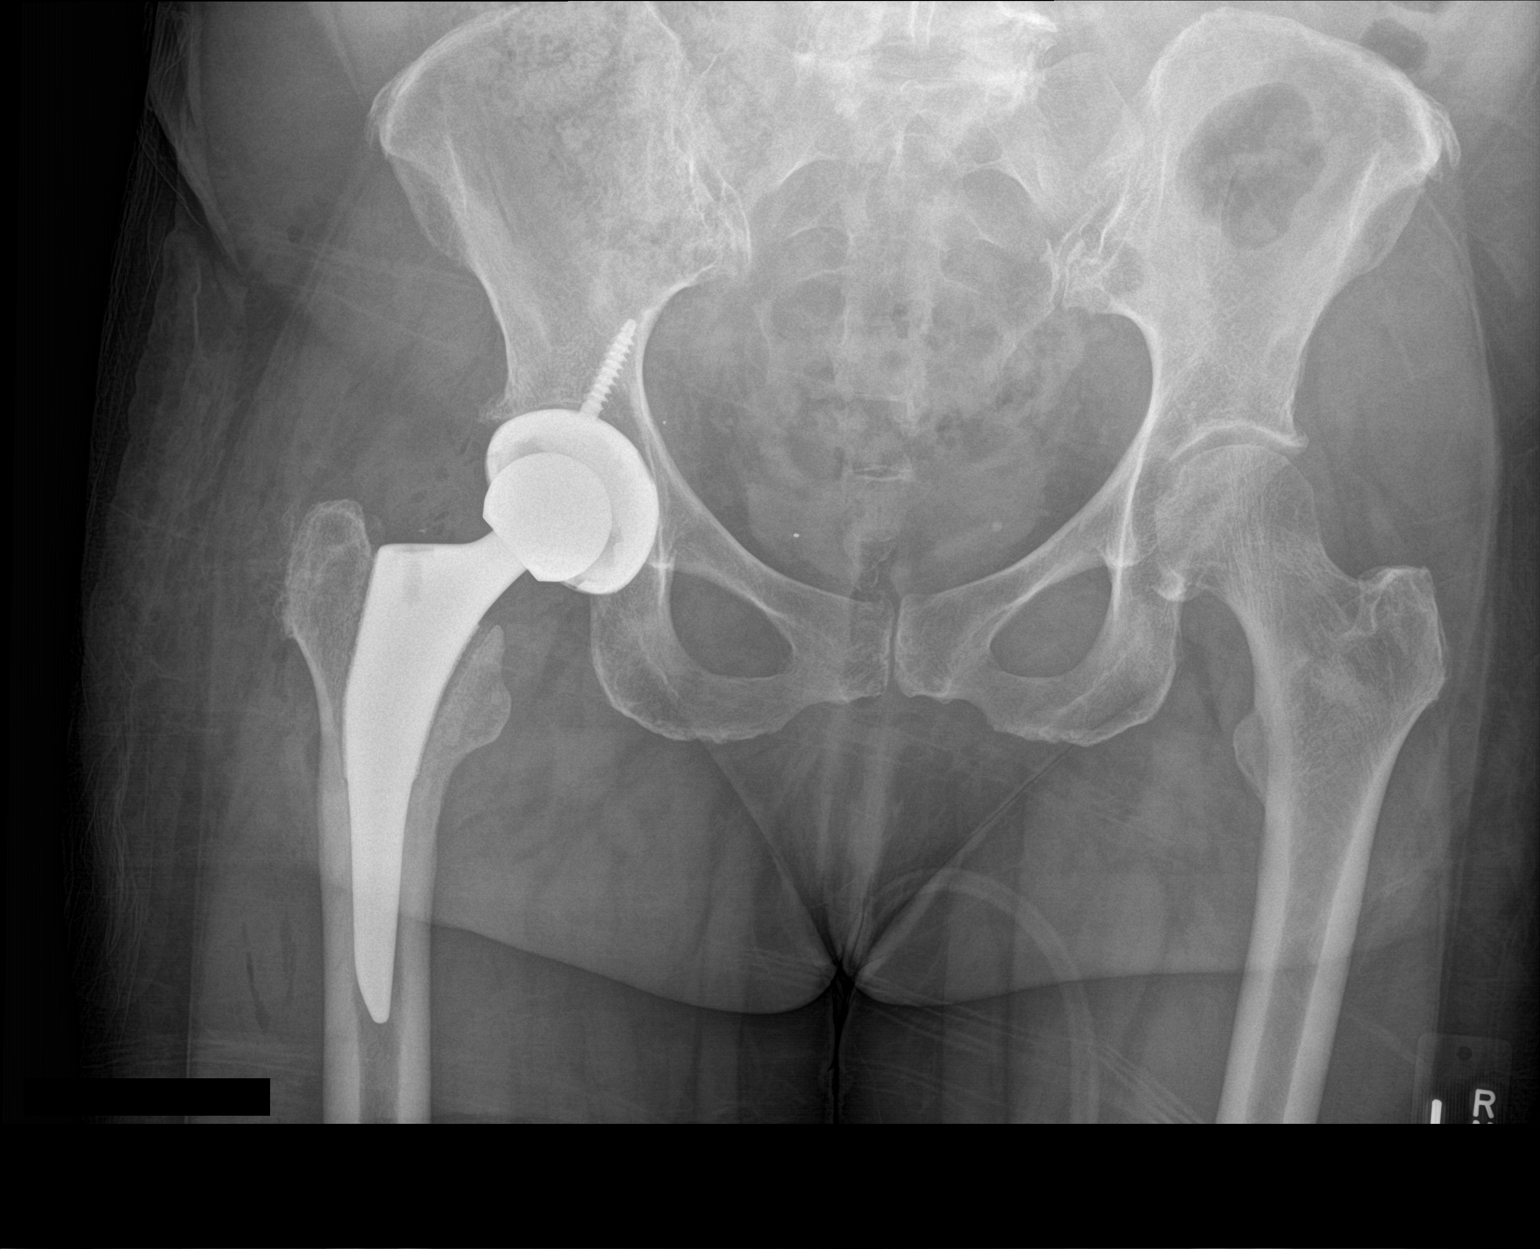

[hip frog leg (2 of 2)]
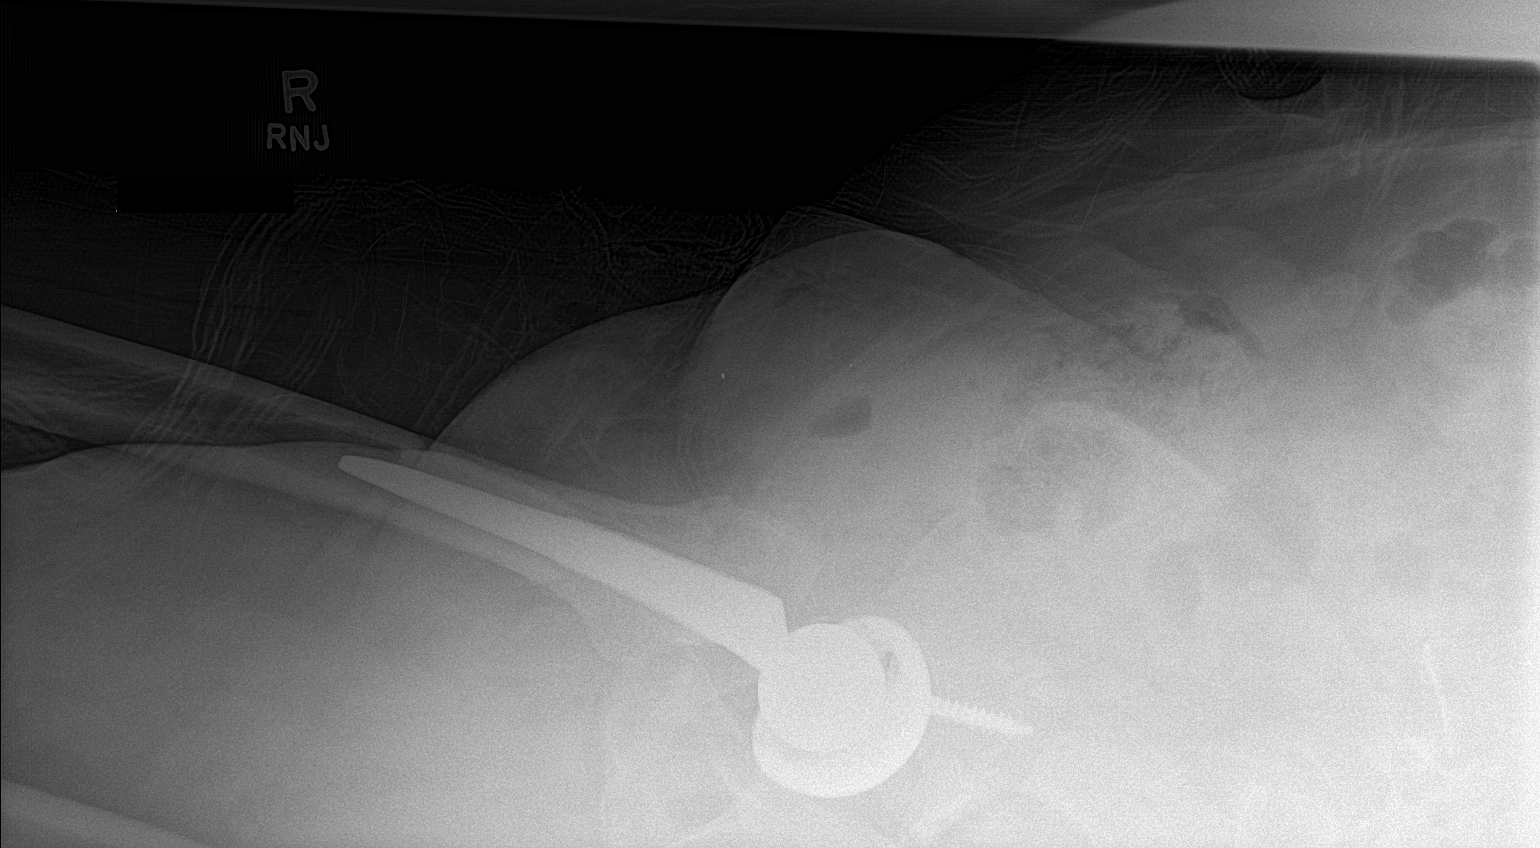

[3 of 3 positions shown; findings below may reference images not displayed]

FINDINGS: Sequelae of interval revision of the patient's right total hip
arthroplasty are identified. There is expected postoperative gas in
the surrounding soft tissues. The prosthetic components are located.
No acute fracture is identified.
IMPRESSION: Interval right hip arthroplasty revision without evidence of acute
complication.
